# Patient Record
Sex: Female | Born: 1945 | Race: White | Hispanic: No | Marital: Married | Smoking: Never smoker
Health system: Southern US, Community
[De-identification: ages and names within clinical notes are randomized; demographics above are authoritative.]

## PROBLEM LIST (undated history)

## (undated) DIAGNOSIS — J449 Chronic obstructive pulmonary disease, unspecified: Secondary | ICD-10-CM

## (undated) DIAGNOSIS — I10 Essential (primary) hypertension: Secondary | ICD-10-CM

## (undated) DIAGNOSIS — T7840XA Allergy, unspecified, initial encounter: Secondary | ICD-10-CM

## (undated) DIAGNOSIS — M1990 Unspecified osteoarthritis, unspecified site: Secondary | ICD-10-CM

## (undated) HISTORY — DX: Essential (primary) hypertension: I10

## (undated) HISTORY — PX: NO PAST SURGERIES: SHX2092

## (undated) HISTORY — PX: JOINT REPLACEMENT: SHX530

## (undated) HISTORY — DX: Unspecified osteoarthritis, unspecified site: M19.90

## (undated) HISTORY — DX: Allergy, unspecified, initial encounter: T78.40XA

---

## 2013-01-05 DIAGNOSIS — IMO0002 Reserved for concepts with insufficient information to code with codable children: Secondary | ICD-10-CM | POA: Diagnosis not present

## 2013-05-05 DIAGNOSIS — H538 Other visual disturbances: Secondary | ICD-10-CM | POA: Diagnosis not present

## 2013-05-05 DIAGNOSIS — H539 Unspecified visual disturbance: Secondary | ICD-10-CM | POA: Diagnosis not present

## 2013-05-07 DIAGNOSIS — H43819 Vitreous degeneration, unspecified eye: Secondary | ICD-10-CM | POA: Diagnosis not present

## 2013-06-11 DIAGNOSIS — K921 Melena: Secondary | ICD-10-CM | POA: Diagnosis not present

## 2013-06-26 DIAGNOSIS — Z13220 Encounter for screening for lipoid disorders: Secondary | ICD-10-CM | POA: Diagnosis not present

## 2013-06-26 DIAGNOSIS — Z23 Encounter for immunization: Secondary | ICD-10-CM | POA: Diagnosis not present

## 2013-06-26 DIAGNOSIS — Z1329 Encounter for screening for other suspected endocrine disorder: Secondary | ICD-10-CM | POA: Diagnosis not present

## 2013-06-26 DIAGNOSIS — I10 Essential (primary) hypertension: Secondary | ICD-10-CM | POA: Diagnosis not present

## 2013-06-26 DIAGNOSIS — E568 Deficiency of other vitamins: Secondary | ICD-10-CM | POA: Diagnosis not present

## 2013-07-03 DIAGNOSIS — H43399 Other vitreous opacities, unspecified eye: Secondary | ICD-10-CM | POA: Diagnosis not present

## 2013-07-03 DIAGNOSIS — H35039 Hypertensive retinopathy, unspecified eye: Secondary | ICD-10-CM | POA: Diagnosis not present

## 2013-07-06 DIAGNOSIS — Z124 Encounter for screening for malignant neoplasm of cervix: Secondary | ICD-10-CM | POA: Diagnosis not present

## 2013-07-06 DIAGNOSIS — I10 Essential (primary) hypertension: Secondary | ICD-10-CM | POA: Diagnosis not present

## 2013-07-06 DIAGNOSIS — N760 Acute vaginitis: Secondary | ICD-10-CM | POA: Diagnosis not present

## 2013-07-06 DIAGNOSIS — Z0000 Encounter for general adult medical examination without abnormal findings: Secondary | ICD-10-CM | POA: Diagnosis not present

## 2013-07-06 DIAGNOSIS — Z0100 Encounter for examination of eyes and vision without abnormal findings: Secondary | ICD-10-CM | POA: Diagnosis not present

## 2013-07-06 DIAGNOSIS — Z01419 Encounter for gynecological examination (general) (routine) without abnormal findings: Secondary | ICD-10-CM | POA: Diagnosis not present

## 2013-07-06 DIAGNOSIS — Z1331 Encounter for screening for depression: Secondary | ICD-10-CM | POA: Diagnosis not present

## 2013-07-12 DIAGNOSIS — K921 Melena: Secondary | ICD-10-CM | POA: Diagnosis not present

## 2013-07-12 DIAGNOSIS — D126 Benign neoplasm of colon, unspecified: Secondary | ICD-10-CM | POA: Diagnosis not present

## 2013-07-12 DIAGNOSIS — K922 Gastrointestinal hemorrhage, unspecified: Secondary | ICD-10-CM | POA: Diagnosis not present

## 2013-07-12 DIAGNOSIS — K648 Other hemorrhoids: Secondary | ICD-10-CM | POA: Diagnosis not present

## 2013-07-13 DIAGNOSIS — Z0000 Encounter for general adult medical examination without abnormal findings: Secondary | ICD-10-CM | POA: Diagnosis not present

## 2013-10-11 DIAGNOSIS — R55 Syncope and collapse: Secondary | ICD-10-CM | POA: Diagnosis not present

## 2013-10-11 DIAGNOSIS — R0602 Shortness of breath: Secondary | ICD-10-CM | POA: Diagnosis not present

## 2013-10-11 DIAGNOSIS — I10 Essential (primary) hypertension: Secondary | ICD-10-CM | POA: Diagnosis not present

## 2013-10-11 DIAGNOSIS — I453 Trifascicular block: Secondary | ICD-10-CM | POA: Diagnosis not present

## 2013-11-02 DIAGNOSIS — R0602 Shortness of breath: Secondary | ICD-10-CM | POA: Diagnosis not present

## 2013-11-02 DIAGNOSIS — J309 Allergic rhinitis, unspecified: Secondary | ICD-10-CM | POA: Diagnosis not present

## 2013-11-09 DIAGNOSIS — R0989 Other specified symptoms and signs involving the circulatory and respiratory systems: Secondary | ICD-10-CM | POA: Diagnosis not present

## 2013-11-09 DIAGNOSIS — R0609 Other forms of dyspnea: Secondary | ICD-10-CM | POA: Diagnosis not present

## 2013-12-04 DIAGNOSIS — R0602 Shortness of breath: Secondary | ICD-10-CM | POA: Diagnosis not present

## 2013-12-07 DIAGNOSIS — R0602 Shortness of breath: Secondary | ICD-10-CM | POA: Diagnosis not present

## 2013-12-22 DIAGNOSIS — Z1231 Encounter for screening mammogram for malignant neoplasm of breast: Secondary | ICD-10-CM | POA: Diagnosis not present

## 2013-12-22 DIAGNOSIS — Z9189 Other specified personal risk factors, not elsewhere classified: Secondary | ICD-10-CM | POA: Diagnosis not present

## 2014-01-28 ENCOUNTER — Ambulatory Visit (HOSPITAL_COMMUNITY)
Admission: RE | Admit: 2014-01-28 | Discharge: 2014-01-28 | Disposition: A | Discharge status: Home / Self Care | Payer: Medicare Other | Source: Ambulatory Visit | Attending: Family Medicine | Admitting: Family Medicine

## 2014-01-28 ENCOUNTER — Ambulatory Visit (INDEPENDENT_AMBULATORY_CARE_PROVIDER_SITE_OTHER): Payer: Medicare Other | Admitting: Family Medicine

## 2014-01-28 VITALS — BP 114/62 | HR 71 | Temp 98.1°F | Resp 16 | Ht 65.5 in | Wt 169.0 lb

## 2014-01-28 DIAGNOSIS — M79662 Pain in left lower leg: Secondary | ICD-10-CM

## 2014-01-28 DIAGNOSIS — M7989 Other specified soft tissue disorders: Secondary | ICD-10-CM | POA: Diagnosis not present

## 2014-01-28 DIAGNOSIS — I8390 Asymptomatic varicose veins of unspecified lower extremity: Secondary | ICD-10-CM

## 2014-01-28 DIAGNOSIS — M79609 Pain in unspecified limb: Secondary | ICD-10-CM | POA: Insufficient documentation

## 2014-01-28 DIAGNOSIS — I8392 Asymptomatic varicose veins of left lower extremity: Secondary | ICD-10-CM

## 2014-01-28 NOTE — Progress Notes (Signed)
 Chief Complaint:  Chief Complaint  Patient presents with  . Leg Pain    x 1 day.  bruising  Left    HPI: Kristy Jacobs is a 68 y.o. female who is here for  1 day history of left calf pain and swelling with bruising ( which has improved, she has knot in the back of her leg), she recently moved here from [place] to be with her famly. She was in the car for 12 hours with small breaks. She has been very sedentary. She has no fevers , chills, SOB, CP. + dizziness x 2 days before this occurred. No other risk factors for PE. She denies recent surgery, prior DVT/PE, malignancy.  Past Medical History  Diagnosis Date  . Allergy   . Arthritis   . Hypertension    History reviewed. No pertinent past surgical history. History   Social History  . Marital Status: Married    Spouse Name: N/A    Number of Children: N/A  . Years of Education: N/A   Social History Main Topics  . Smoking status: Never Smoker   . Smokeless tobacco: None  . Alcohol Use: None  . Drug Use: None  . Sexual Activity: None   Other Topics Concern  . None   Social History Narrative  . None   Family History  Problem Relation Age of Onset  . Hypertension Mother   . Hypertension Father   . Cancer Father   . Stroke Father   . Hypertension Brother   . Hypertension Sister    Allergies  Allergen Reactions  . Compazine [Prochlorperazine Edisylate] Nausea And Vomiting   Prior to Admission medications   Medication Sig Start Date End Date Taking? Authorizing Provider  lisinopril (PRINIVIL,ZESTRIL) 20 MG tablet Take 20 mg by mouth daily.   Yes Historical Provider, MD     ROS: The patient denies fevers, chills, night sweats, unintentional weight loss, chest pain, palpitations, wheezing, dyspnea on exertion, nausea, vomiting, abdominal pain, dysuria, hematuria, melena, numbness, weakness, or tingling.   All other systems have been reviewed and were otherwise negative with the exception of those mentioned in the  HPI and as above.    PHYSICAL EXAM: Filed Vitals:   01/28/14 0847  BP: 114/62  Pulse: 71  Temp: 98.1 F (36.7 C)  Resp: 16   Filed Vitals:   01/28/14 0847  Height: 5' 5.5" (1.664 m)  Weight: 169 lb (76.658 kg)   Body mass index is 27.69 kg/(m^2).  General: Alert, no acute distress HEENT:  Normocephalic, atraumatic, oropharynx patent. EOMI, PERRLA Cardiovascular:  Regular rate and rhythm, no rubs murmurs or gallops.  No Carotid bruits, radial pulse intact. No pedal edema.  Respiratory: Clear to auscultation bilaterally.  No wheezes, rales, or rhonchi.  No cyanosis, no use of accessory musculature GI: No organomegaly, abdomen is soft and non-tender, positive bowel sounds.  No masses. Skin: No rashes. Neurologic: Facial musculature symmetric. Psychiatric: Patient is appropriate throughout our interaction. Lymphatic: No cervical lymphadenopathy Musculoskeletal: Gait intact. + left calf pain, + calf bruise, + calf firmness 5/5 strength    LABS: No results found for this or any previous visit.   EKG/XRAY:   Primary read interpreted by Dr. Marin Comment at [facility].   ASSESSMENT/PLAN: Encounter Diagnoses  Name Primary?  . Calf pain, left Yes  . Calf swelling   . Varicose veins of left lower extremity    Rule out DVT More likely  superficial varicose vein hemorrhage but she  has never had this before.  She was recently in a 12 hr car ride, no other risk factors.  Fu after Doppler results.   Gross sideeffects, risk and benefits, and alternatives of medications d/w patient. Patient is aware that all medications have potential sideeffects and we are unable to predict every sideeffect or drug-drug interaction that may occur.  , [place], DO 01/28/2014 9:29 AM

## 2014-01-31 ENCOUNTER — Telehealth: Payer: Self-pay

## 2014-01-31 NOTE — Telephone Encounter (Signed)
See below. Pt says she's talked to Dr. Marin Comment already about this.   PLease let her know official Doppler was negative. If the area bothers her then she can try compressions stockings or I can refer her to vascular for further treatment options. She can let me know if she wants the referral or if she wants to wait   Thanks  Dr Marin Comment

## 2014-01-31 NOTE — Telephone Encounter (Signed)
Message copied by Constance Goltz on Thu Jan 31, 2014  6:29 PM ------      Message from: Rikki Spearing P      Created: Thu Jan 31, 2014  7:53 AM       PLease let her know official Doppler was negative. If the area bothers her then she can try compressions stockings or I can refer her to vascular for further treatment options. She can let me know if she wants the referral or if she wants to wait             Thanks      Dr Marin Comment ------

## 2014-03-21 ENCOUNTER — Ambulatory Visit (INDEPENDENT_AMBULATORY_CARE_PROVIDER_SITE_OTHER): Payer: Medicare Other | Admitting: Family Medicine

## 2014-03-21 VITALS — BP 124/80 | HR 76 | Temp 98.6°F | Resp 16 | Ht 65.5 in | Wt 173.0 lb

## 2014-03-21 DIAGNOSIS — L821 Other seborrheic keratosis: Secondary | ICD-10-CM

## 2014-03-21 DIAGNOSIS — B354 Tinea corporis: Secondary | ICD-10-CM

## 2014-03-21 MED ORDER — MICONAZOLE NITRATE 2 % EX CREA: 1.0000 "application " | TOPICAL_CREAM | Freq: Two times a day (BID) | CUTANEOUS | Status: DC

## 2014-03-21 NOTE — Progress Notes (Signed)
  Kristy Jacobs - 68 y.o. female MRN [id number]  Date of birth: Oct 18, 1945  CC & HPI:  The patient presents for evaluation of: Chief Complaint  Patient presents with  . Rash    right shoulder rash x2 weeks; spreaded from arm; denies fever/chills; OTC Benadryl itch cream with no relief.  Reports thinking it was poison IVY from her cat.  No issues with rash on regular basis.  Hx of contact dermatitis   Also reports dark area on left cheek.  Has been present for >2 years.  No significant changes but prominent.   ROS:  Per HPI.   HISTORY: Past Medical, Surgical, Social, and Family History Reviewed & Updated per EMR.  Pertinent Historical Findings include: Hx of HTN, Allergy, HTN.  No kidney or liver issues. No prior Skin CA Non-smoker  OBJECTIVE FINDINGS:  VS:   HT:5' 5.5" (166.4 cm)   WT:173 lb (78.472 kg)  BMI:28.4          BP:124/80 mmHg  HR:76bpm  TEMP:98.6 F (37 C)(Oral)  RESP:97 %  PHYSICAL EXAM: GENERAL: Adult Caucasian  female. In no discomfort; no respiratory distress   PSYCH: alert and appropriate, good insight   SKIN: Left Cheek: 0.5cm waxy macule, well circumscribed Left forearm: small area of hyperpigmentation consistent with post-inflammatory process. Right Axilla: erythematous and scaly 1.5X4cm lesion over R anterior axillary region. No streaking erythema but slight surrounding erythema.     ASSESSMENT: 1. Tinea corporis   2. Seborrheic keratoses    PLAN: See problem based charting & AVS for additional documentation. Meds ordered this encounter  Medications  . miconazole (MICATIN) 2 % cream    Sig: Apply 1 application topically 2 (two) times daily. Apply for 14 days    Dispense:  28.35 g    Refill:  0    No orders of the defined types were placed in this encounter.      Discussed indication for removal of SK.  Recommend follow up if worsening for consideration of cryotherapy > Return if symptoms worsen or fail to improve.

## 2014-03-21 NOTE — Patient Instructions (Signed)

## 2014-04-05 NOTE — Progress Notes (Signed)
History and physical examinations reviewed; agree with assessment and plan.

## 2014-07-19 ENCOUNTER — Encounter: Payer: Self-pay | Admitting: Family Medicine

## 2014-07-19 ENCOUNTER — Ambulatory Visit (INDEPENDENT_AMBULATORY_CARE_PROVIDER_SITE_OTHER): Payer: Medicare Other | Admitting: Family Medicine

## 2014-07-19 VITALS — BP 154/83 | HR 63 | Temp 97.7°F | Resp 16 | Ht 65.0 in | Wt 169.0 lb

## 2014-07-19 DIAGNOSIS — Z1329 Encounter for screening for other suspected endocrine disorder: Secondary | ICD-10-CM | POA: Diagnosis not present

## 2014-07-19 DIAGNOSIS — Z2821 Immunization not carried out because of patient refusal: Secondary | ICD-10-CM | POA: Diagnosis not present

## 2014-07-19 DIAGNOSIS — Z8349 Family history of other endocrine, nutritional and metabolic diseases: Secondary | ICD-10-CM | POA: Diagnosis not present

## 2014-07-19 DIAGNOSIS — B372 Candidiasis of skin and nail: Secondary | ICD-10-CM

## 2014-07-19 DIAGNOSIS — Z0000 Encounter for general adult medical examination without abnormal findings: Secondary | ICD-10-CM | POA: Diagnosis not present

## 2014-07-19 DIAGNOSIS — I10 Essential (primary) hypertension: Secondary | ICD-10-CM

## 2014-07-19 DIAGNOSIS — R0602 Shortness of breath: Secondary | ICD-10-CM

## 2014-07-19 DIAGNOSIS — Z1231 Encounter for screening mammogram for malignant neoplasm of breast: Secondary | ICD-10-CM | POA: Diagnosis not present

## 2014-07-19 LAB — CBC
HCT: 43.4 % (ref 36.0–46.0)
Hemoglobin: 14.5 g/dL (ref 12.0–15.0)
MCH: 28.2 pg (ref 26.0–34.0)
MCHC: 33.4 g/dL (ref 30.0–36.0)
MCV: 84.3 fL (ref 78.0–100.0)
MPV: 10.9 fL (ref 8.6–12.4)
Platelets: 329 10*3/uL (ref 150–400)
RBC: 5.15 MIL/uL — ABNORMAL HIGH (ref 3.87–5.11)
RDW: 13.9 % (ref 11.5–15.5)
WBC: 7.3 10*3/uL (ref 4.0–10.5)

## 2014-07-19 LAB — LIPID PANEL
Cholesterol: 195 mg/dL (ref 0–200)
HDL: 65 mg/dL (ref >39)
LDL Cholesterol: 110 mg/dL — ABNORMAL HIGH (ref 0–99)
Total CHOL/HDL Ratio: 3 ratio
Triglycerides: 99 mg/dL (ref <150)
VLDL: 20 mg/dL (ref 0–40)

## 2014-07-19 LAB — COMPLETE METABOLIC PANEL WITHOUT GFR
ALT: 13 U/L (ref 0–35)
Albumin: 3.9 g/dL (ref 3.5–5.2)
Alkaline Phosphatase: 77 U/L (ref 39–117)
BUN: 17 mg/dL (ref 6–23)
CO2: 27 meq/L (ref 19–32)
Chloride: 103 meq/L (ref 96–112)
Glucose, Bld: 76 mg/dL (ref 70–99)
Total Bilirubin: 0.5 mg/dL (ref 0.2–1.2)

## 2014-07-19 LAB — COMPLETE METABOLIC PANEL WITH GFR
AST: 19 U/L (ref 0–37)
Calcium: 9.2 mg/dL (ref 8.4–10.5)
Creat: 0.88 mg/dL (ref 0.50–1.10)
GFR, Est African American: 79 mL/min
GFR, Est Non African American: 69 mL/min
Potassium: 4.7 mEq/L (ref 3.5–5.3)
Sodium: 138 mEq/L (ref 135–145)
Total Protein: 7.2 g/dL (ref 6.0–8.3)

## 2014-07-19 MED ORDER — NYSTATIN 100000 UNIT/GM EX POWD: CUTANEOUS | Status: DC

## 2014-07-19 MED ORDER — LISINOPRIL 20 MG PO TABS: 20.0000 mg | ORAL_TABLET | Freq: Every day | ORAL | Status: DC

## 2014-07-19 NOTE — Progress Notes (Signed)
Chief Complaint:  Chief Complaint  Patient presents with  . Annual Exam  . Gynecologic Exam    HPI: Kristy Jacobs is a 69 y.o. female who is here for PE with pap Last pap January 2015, no prior abnormal pap Last mammogram July 2015 No uterine, ovarian, breast cancer Late 31s LMP, no vaginal bleeding or spotting after cessation of period She does not do flu vaccine She has had PNA vaccine ? Last 2 years She has had TDap in March 2014 Has had shingles 2011 , mild case, on bra line She has had colonscopy , 2015, has had 3 and all normal.  HAs regular eye exams Has had a bone density scan and was normal Needs BP medications and also has candida intertrigo underneath her bra HAs  ahistory of SOB and has seen cardiology and also pumlnology for SOB and PFTs, was given inhaler but never used.     Past Medical History  Diagnosis Date  . Allergy   . Arthritis   . Hypertension    History reviewed. No pertinent past surgical history. History   Social History  . Marital Status: Married    Spouse Name: N/A    Number of Children: N/A  . Years of Education: N/A   Social History Main Topics  . Smoking status: Never Smoker   . Smokeless tobacco: None  . Alcohol Use: 1.2 oz/week    2 Glasses of wine per week  . Drug Use: No  . Sexual Activity: Yes   Other Topics Concern  . None   Social History Narrative   Family History  Problem Relation Age of Onset  . Hypertension Mother   . Hypertension Father   . Cancer Father   . Stroke Father   . Hypertension Brother   . Hypertension Sister    Allergies  Allergen Reactions  . Compazine [Prochlorperazine Edisylate] Nausea And Vomiting   Prior to Admission medications   Medication Sig Start Date End Date Taking? Authorizing Provider  lisinopril (PRINIVIL,ZESTRIL) 20 MG tablet Take 20 mg by mouth daily.   Yes Historical Provider, MD  nystatin cream (MYCOSTATIN) Apply 1 application topically 2 (two) times daily.   Yes  Historical Provider, MD     ROS: The patient denies fevers, chills, night sweats, unintentional weight loss, chest pain, palpitations, wheezing, dyspnea on exertion, nausea, vomiting, abdominal pain, dysuria, hematuria, melena, numbness, weakness, or tingling.   All other systems have been reviewed and were otherwise negative with the exception of those mentioned in the HPI and as above.    PHYSICAL EXAM: Filed Vitals:   07/19/14 1112  BP: 154/83  Pulse: 63  Temp: 97.7 F (36.5 C)  Resp: 16   Filed Vitals:   07/19/14 1112  Height: 5\' 5"  (1.651 m)  Weight: 169 lb (76.658 kg)   Body mass index is 28.12 kg/(m^2).  General: Alert, no acute distress HEENT:  Normocephalic, atraumatic, oropharynx patent. EOMI, PERRLA Cardiovascular:  Regular rate and rhythm, no rubs murmurs or gallops.  No Carotid bruits, radial pulse intact. No pedal edema.  Respiratory: Clear to auscultation bilaterally.  No wheezes, rales, or rhonchi.  No cyanosis, no use of accessory musculature GI: No organomegaly, abdomen is soft and non-tender, positive bowel sounds.  No masses. Skin: No rashes. Neurologic: Facial musculature symmetric. Psychiatric: Patient is appropriate throughout our interaction. Lymphatic: No cervical lymphadenopathy Musculoskeletal: Gait intact. Breast exam normal, defer GU exam since no vaginal dc or sxs   LABS: No  results found for this or any previous visit.   EKG/XRAY:   Primary read interpreted by Dr. Marin Comment at [facility].   ASSESSMENT/PLAN: Encounter Diagnoses  Name Primary?  . Essential hypertension   . Annual physical exam Yes  . Candidal intertrigo   . SOB (shortness of breath)   . Screening for thyroid disorder   . 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 Patient is aware that all medications have potential sideeffects and we are unable to predict every sideeffect or drug-drug interaction that may occur.  [name], [place], DO 07/19/2014 12:28 PM

## 2014-07-20 LAB — TSH: TSH: 1.471 u[IU]/mL (ref 0.350–4.500)

## 2014-07-30 ENCOUNTER — Encounter: Payer: Self-pay | Admitting: Family Medicine

## 2014-10-23 ENCOUNTER — Telehealth: Payer: Self-pay | Admitting: Family Medicine

## 2014-10-23 NOTE — Telephone Encounter (Signed)
lmom to call back and set another appt up with another provider in aug. Dr Marin Comment isn't doing anymore appt

## 2015-01-17 ENCOUNTER — Ambulatory Visit: Payer: Medicare Other | Admitting: Family Medicine

## 2015-06-14 ENCOUNTER — Ambulatory Visit (INDEPENDENT_AMBULATORY_CARE_PROVIDER_SITE_OTHER): Payer: Medicare Other | Admitting: Family Medicine

## 2015-06-14 ENCOUNTER — Ambulatory Visit (INDEPENDENT_AMBULATORY_CARE_PROVIDER_SITE_OTHER): Payer: Medicare Other

## 2015-06-14 VITALS — BP 122/76 | HR 70 | Temp 98.2°F | Resp 16 | Ht 65.0 in | Wt 167.0 lb

## 2015-06-14 DIAGNOSIS — R0600 Dyspnea, unspecified: Secondary | ICD-10-CM

## 2015-06-14 DIAGNOSIS — R05 Cough: Secondary | ICD-10-CM

## 2015-06-14 DIAGNOSIS — I4510 Unspecified right bundle-branch block: Secondary | ICD-10-CM

## 2015-06-14 DIAGNOSIS — R059 Cough, unspecified: Secondary | ICD-10-CM

## 2015-06-14 DIAGNOSIS — R0609 Other forms of dyspnea: Secondary | ICD-10-CM

## 2015-06-14 DIAGNOSIS — R062 Wheezing: Secondary | ICD-10-CM | POA: Diagnosis not present

## 2015-06-14 DIAGNOSIS — J069 Acute upper respiratory infection, unspecified: Secondary | ICD-10-CM

## 2015-06-14 MED ORDER — ALBUTEROL SULFATE HFA 108 (90 BASE) MCG/ACT IN AERS: 1.0000 | INHALATION_SPRAY | RESPIRATORY_TRACT | Status: DC | PRN

## 2015-06-14 NOTE — Addendum Note (Signed)
Addended by: Merri Ray R on: 06/14/2015 11:31 AM   Modules accepted: Orders

## 2015-06-14 NOTE — Progress Notes (Addendum)
Subjective:  This chart was scribed for Kristy Ray, MD by [facility], medical scribe at [facility].The patient was seen in exam room 10 and the patient's care was started at 10:31 AM.   Patient ID: Kristy Jacobs, female    DOB: [birth year], 69 y.o.   MRN: [id number] Chief Complaint  Patient presents with  . head congestion    x 2 days  . Wheezing  . Cough    x 5 days  . Dizziness    x 4 days, comes and goes    HPI HPI Comments: Kristy Jacobs is a 69 y.o. female who presents to [facility] with multiple concerns. Symptoms began about 5 days ago. At that time she developed a fever of 102 , but no fever for the past 3 days. The non productive cough has persisted, she also has SOB,  post nasal drip, sinus congestion and wheezing at night when laying down. She does not notice any wheezing during the day. Intermittent dizziness but no syncope episodes. No nasal discharge. Taking mucinex, also drinking tea with honey. No stroke like symptoms, no focal weakness, no CP. Sick contacts at home.  Increased SOB with activity.  First noticed this 10-15 years ago but more prevalent recently. Pulmonary function testing and stress test 2 years ago in [place]. Told she had an electrical issue with her heart, but she cannot remember the name of the condition (possibly RBBB). Told she may need a pacemaker, and she could have possible syncopal episodes. No hx of asthma or COPD, heart failure. Fhx of HTN, father had a stroke. 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  HENT: Positive for congestion, postnasal drip and sinus pressure.   Respiratory: Positive for cough, shortness of breath and wheezing. Negative for chest tightness.   Cardiovascular: Negative for chest pain, palpitations and leg swelling.  Gastrointestinal: Negative for abdominal pain  and blood in stool.  Neurological: Positive for dizziness. Negative for syncope, weakness, light-headedness and headaches.      Objective:   Physical Exam  Constitutional: She is oriented to person, place, and time. She appears well-developed and well-nourished. No distress.  HENT:  Head: Normocephalic and atraumatic.  Right Ear: Hearing, tympanic membrane, external ear and ear canal normal.  Left Ear: Hearing, tympanic membrane, external ear and ear canal normal.  Nose: Nose normal.  Mouth/Throat: Oropharynx is clear and moist and mucous membranes are normal. No oropharyngeal exudate.  Eyes: Conjunctivae and EOM are normal. Pupils are equal, round, and reactive to light.  Neck: Carotid bruit is not present.  Cardiovascular: Normal rate, regular rhythm, normal heart sounds and intact distal pulses.   No murmur heard. Pulmonary/Chest: Effort normal and breath sounds normal. No respiratory distress. She has no wheezes. She has no rhonchi.  Abdominal: Soft. She exhibits no pulsatile midline mass. There is no tenderness.  Neurological: She is alert and oriented to person, place, and time.  Skin: Skin is warm and dry. No rash noted.  Psychiatric: She has a normal mood and affect. Her behavior is normal.  Vitals reviewed. EKG compared to 2014 RBBB difficult interpretation on previous EKG due to scan, but no apparent changed. [facility] reading (PRIMARY) by Dr.Dayonna Selbe: Chest x-Jacobs shows no acute disease.      Assessment & Plan:   Kataryna Kin is a 69 y.o. female Cough -  Plan: DG Chest 2 View, albuterol (PROVENTIL HFA;VENTOLIN HFA) 108 (90 Base) MCG/ACT inhaler Wheezing - Plan: albuterol (PROVENTIL HFA;VENTOLIN HFA) 108 (90 Base) MCG/ACT inhaler Acute upper respiratory infection  -Suspected viral URI, now afebrile and improving. Intermittent wheezing night may actually be some congestion, but clear on exam today. Albuterol was prescribed if needed, but side effects discussed and RTC precautions  if persistent or frequent use. Continue symptomatic care with saline nasal spray, Mucinex, RTC precautions.  DOE (dyspnea on exertion) - Plan: EKG 12-Lead, DG Chest 2 View RBBB - Plan: EKG 12-Lead  -Reported normal workup by both pulmonary and cardiology 2 years ago except for bundle branch block. Will refer to cardiologist locally to decide on other testing if needed as it appears her dyspnea on exertion may be more frequent. ER/RTC/911 precautions discussed. Advised against increasing exercise until she is cleared to do so by cardiology.    Meds ordered this encounter  Medications  . GuaiFENesin (MUCINEX PO)    Sig: Take by mouth.  Marland Kitchen albuterol (PROVENTIL HFA;VENTOLIN HFA) 108 (90 Base) MCG/ACT inhaler    Sig: Inhale 1-2 puffs into the lungs every 4 (four) hours as needed for wheezing or shortness of breath.    Dispense:  1 Inhaler    Refill:  0   Patient Instructions  I suspect you had a viral illness or flulike illness that is improving. Continue Mucinex or Mucinex DM as needed for cough, drink plenty of fluids, saline nasal spray as needed for nasal congestion. These are all over-the-counter. If you do wheeze at night, you can try 1-2 puffs of the albuterol up to every 6 hours. If you have to use this medicine frequently or more than the next few days, return for recheck.   I will refer you to a cardiologist locally to discuss your shortness of breath with exertion, but I do not see any apparent changes on your EKG in the office.Return to the clinic or go to the nearest emergency room if any of your symptoms worsen or new symptoms occur.       By signing my name below, I, Nadim Abuhashem, attest that this documentation has been prepared under the direction and in the presence of Kristy Ray, MD.  Electronically Signed: Lora Havens, medical scribe. 06/14/2015, 11:17 AM.

## 2015-06-14 NOTE — Patient Instructions (Signed)
I suspect you had a viral illness or flulike illness that is improving. Continue Mucinex or Mucinex DM as needed for cough, drink plenty of fluids, saline nasal spray as needed for nasal congestion. These are all over-the-counter. If you do wheeze at night, you can try 1-2 puffs of the albuterol up to every 6 hours. If you have to use this medicine frequently or more than the next few days, return for recheck.   I will refer you to a cardiologist locally to discuss your shortness of breath with exertion, but I do not see any apparent changes on your EKG in the office.Return to the clinic or go to the nearest emergency room if any of your symptoms worsen or new symptoms occur.

## 2015-06-27 ENCOUNTER — Telehealth: Payer: Self-pay | Admitting: Family Medicine

## 2015-06-27 NOTE — Telephone Encounter (Signed)
Appt to be made on Feb. 7 @ 8:45 am per Erline Levine.Pt notified and Dr. Marin Comment made aware.

## 2015-06-27 NOTE — Telephone Encounter (Signed)
-----   Message from Glenford Bayley, DO sent at 06/27/2015  9:04 AM EST ----- Regarding: Feb 7 or 9 appt Hi all-  Can you schedule me an appt for Mrs Makhala Parkman for a full physical in the AM on either Feb 7 or Feb 9.   Can you call the patient when you make the appt, I have already spoken with Dr Tamala Julian and she is ok with me taking 1 room in the morning for this specific appt.   Thanks much, Dr Marin Comment

## 2015-07-21 DIAGNOSIS — M1990 Unspecified osteoarthritis, unspecified site: Secondary | ICD-10-CM | POA: Insufficient documentation

## 2015-07-21 DIAGNOSIS — T7840XA Allergy, unspecified, initial encounter: Secondary | ICD-10-CM | POA: Insufficient documentation

## 2015-07-21 DIAGNOSIS — I10 Essential (primary) hypertension: Secondary | ICD-10-CM | POA: Insufficient documentation

## 2015-07-22 ENCOUNTER — Ambulatory Visit (INDEPENDENT_AMBULATORY_CARE_PROVIDER_SITE_OTHER): Payer: Medicare Other | Admitting: Family Medicine

## 2015-07-22 ENCOUNTER — Encounter: Payer: Self-pay | Admitting: Family Medicine

## 2015-07-22 VITALS — BP 135/80 | HR 71 | Temp 98.1°F | Resp 16 | Ht 65.0 in | Wt 163.6 lb

## 2015-07-22 DIAGNOSIS — I10 Essential (primary) hypertension: Secondary | ICD-10-CM

## 2015-07-22 DIAGNOSIS — R21 Rash and other nonspecific skin eruption: Secondary | ICD-10-CM | POA: Diagnosis not present

## 2015-07-22 DIAGNOSIS — E2839 Other primary ovarian failure: Secondary | ICD-10-CM | POA: Diagnosis not present

## 2015-07-22 DIAGNOSIS — Z1329 Encounter for screening for other suspected endocrine disorder: Secondary | ICD-10-CM | POA: Diagnosis not present

## 2015-07-22 DIAGNOSIS — E7800 Pure hypercholesterolemia, unspecified: Secondary | ICD-10-CM

## 2015-07-22 DIAGNOSIS — Z1231 Encounter for screening mammogram for malignant neoplasm of breast: Secondary | ICD-10-CM

## 2015-07-22 DIAGNOSIS — Z124 Encounter for screening for malignant neoplasm of cervix: Secondary | ICD-10-CM

## 2015-07-22 DIAGNOSIS — Z0000 Encounter for general adult medical examination without abnormal findings: Secondary | ICD-10-CM | POA: Diagnosis not present

## 2015-07-22 LAB — COMPLETE METABOLIC PANEL WITH GFR
Alkaline Phosphatase: 85 U/L (ref 33–130)
CO2: 27 mmol/L (ref 20–31)
GFR, Est Non African American: 70 mL/min (ref >=60)
Glucose, Bld: 84 mg/dL (ref 65–99)
Potassium: 4 mmol/L (ref 3.5–5.3)
Sodium: 141 mmol/L (ref 135–146)

## 2015-07-22 LAB — LIPID PANEL
Cholesterol: 186 mg/dL (ref 125–200)
HDL: 67 mg/dL (ref >=46)
LDL Cholesterol: 103 mg/dL (ref <130)
Total CHOL/HDL Ratio: 2.8 Ratio (ref <=5.0)
Triglycerides: 81 mg/dL (ref <150)
VLDL: 16 mg/dL (ref <30)

## 2015-07-22 LAB — COMPLETE METABOLIC PANEL WITHOUT GFR
ALT: 12 U/L (ref 6–29)
AST: 20 U/L (ref 10–35)
Albumin: 4.1 g/dL (ref 3.6–5.1)
BUN: 12 mg/dL (ref 7–25)
Calcium: 9.1 mg/dL (ref 8.6–10.4)
Chloride: 104 mmol/L (ref 98–110)
Creat: 0.86 mg/dL (ref 0.50–0.99)
GFR, Est African American: 81 mL/min (ref >=60)
Total Bilirubin: 0.5 mg/dL (ref 0.2–1.2)
Total Protein: 7.4 g/dL (ref 6.1–8.1)

## 2015-07-22 LAB — CBC WITH DIFFERENTIAL/PLATELET
Basophils Absolute: 0.1 10*3/uL (ref 0.0–0.1)
Basophils Relative: 1 % (ref 0–1)
Eosinophils Absolute: 0.2 10*3/uL (ref 0.0–0.7)
Eosinophils Relative: 2 % (ref 0–5)
HCT: 45 % (ref 36.0–46.0)
Hemoglobin: 15 g/dL (ref 12.0–15.0)
Lymphocytes Relative: 19 % (ref 12–46)
Lymphs Abs: 1.4 10*3/uL (ref 0.7–4.0)
MCH: 28.2 pg (ref 26.0–34.0)
MCHC: 33.3 g/dL (ref 30.0–36.0)
MCV: 84.7 fL (ref 78.0–100.0)
MPV: 10.7 fL (ref 8.6–12.4)
Monocytes Absolute: 0.5 10*3/uL (ref 0.1–1.0)
Monocytes Relative: 7 % (ref 3–12)
Neutro Abs: 5.3 10*3/uL (ref 1.7–7.7)
Neutrophils Relative %: 71 % (ref 43–77)
Platelets: 325 10*3/uL (ref 150–400)
RBC: 5.31 MIL/uL — ABNORMAL HIGH (ref 3.87–5.11)
RDW: 14 % (ref 11.5–15.5)
WBC: 7.5 10*3/uL (ref 4.0–10.5)

## 2015-07-22 LAB — TSH: TSH: 1.1 mIU/L

## 2015-07-22 MED ORDER — LISINOPRIL 20 MG PO TABS: 20.0000 mg | ORAL_TABLET | Freq: Every day | ORAL | Status: DC

## 2015-07-22 NOTE — Progress Notes (Addendum)
Chief Complaint:  Chief Complaint  Patient presents with  . Annual Exam    pap    HPI: Kristy Jacobs is a 70 y.o. female who reports to [facility] today complaining of here for annual with pap She is G2L2 She is postmenopausal She is doing well. Declines any pneumonia vaccine, flu vaccine She has no complaints Walks about 1 mile, 15 min She did not get her mammogram last year She has a rash on her nose and needs derm referral    Last OV:  Kristy Jacobs is a 70 y.o. female who is here for PE with pap Last pap January 2015, no prior abnormal pap Last mammogram July 2015 No uterine, ovarian, breast cancer Late 58s LMP, no vaginal bleeding or spotting after cessation of period She does not do flu vaccine She has had PNA vaccine ? Last 2 years She has had TDap in March 2014 Has had shingles 2011 , mild case, on bra line She has had colonscopy , 2015, has had 3 and all normal.  HAs regular eye exams Has had a bone density scan and was normal Needs BP medications and also has candida intertrigo underneath her bra HAs ahistory of SOB and has seen cardiology and also pumlnology for SOB and PFTs, was given inhaler but never used.    Past Medical History  Diagnosis Date  . Allergy   . Arthritis   . Hypertension    Past Surgical History  Procedure Laterality Date  . No past surgeries     Social History   Social History  . Marital Status: Married    Spouse Name: N/A  . Number of Children: N/A  . Years of Education: N/A   Social History Main Topics  . Smoking status: Never Smoker   . Smokeless tobacco: Never Used  . Alcohol Use: 1.2 oz/week    2 Glasses of wine per week  . Drug Use: No  . Sexual Activity: Yes   Other Topics Concern  . None   Social History Narrative   Family History  Problem Relation Age of Onset  . Hypertension Mother   . Hypertension Father   . Cancer Father   . Stroke Father   . Hypertension Brother   . Hypertension Sister     Allergies  Allergen Reactions  . Compazine [Prochlorperazine Edisylate] Nausea And Vomiting   Prior to Admission medications   Medication Sig Start Date End Date Taking? Authorizing Provider  albuterol (PROVENTIL HFA;VENTOLIN HFA) 108 (90 Base) MCG/ACT inhaler Inhale 1-2 puffs into the lungs every 4 (four) hours as needed for wheezing or shortness of breath. 06/14/15  Yes Wendie Agreste, MD  GuaiFENesin (MUCINEX PO) Take by mouth.   Yes Historical Provider, MD  lisinopril (PRINIVIL,ZESTRIL) 20 MG tablet Take 1 tablet (20 mg total) by mouth daily. 07/19/14  Yes Kaitlyne Friedhoff P Fredie Majano, DO  nystatin (MYCOSTATIN/NYSTOP) 100000 UNIT/GM POWD Apply to affected area 07/19/14  Yes Shavell Nored P Fidencio Duddy, DO     ROS: The patient denies fevers, chills, night sweats, unintentional weight loss, chest pain, palpitations, wheezing, dyspnea on exertion, nausea, vomiting, abdominal pain, dysuria, hematuria, melena, numbness, weakness, or tingling.   All other systems have been reviewed and were otherwise negative with the exception of those mentioned in the HPI and as above.    PHYSICAL EXAM: Filed Vitals:   07/22/15 0853 07/22/15 0855  BP: 170/89 135/80  Pulse: 71   Temp: 98.1 F (36.7 C)   Resp: 16  Body mass index is 27.22 kg/(m^2).   General: Alert, no acute distress HEENT:  Normocephalic, atraumatic, oropharynx patent. EOMI, PERRLA Cardiovascular:  Regular rate and rhythm, no rubs murmurs or gallops.  No Carotid bruits, radial pulse intact. No pedal edema.  Respiratory: Clear to auscultation bilaterally.  No wheezes, rales, or rhonchi.  No cyanosis, no use of accessory musculature Abdominal: No organomegaly, abdomen is soft and non-tender, positive bowel sounds. No masses. Skin: No rashes. Neurologic: Facial musculature symmetric. Psychiatric: Patient acts appropriately throughout our interaction. Lymphatic: No cervical or submandibular lymphadenopathy Musculoskeletal: Gait intact. No edema,  tenderness Breast-normal breast exam, no LAD GU-excess skin fkormation around cervical os. Some iminimal inflammation. Neg CMT.    LABS: Results for orders placed or performed in visit on 07/19/14  COMPLETE METABOLIC PANEL WITH GFR  Result Value Ref Range   Sodium 138 135 - 145 mEq/L   Potassium 4.7 3.5 - 5.3 mEq/L   Chloride 103 96 - 112 mEq/L   CO2 27 19 - 32 mEq/L   Glucose, Bld 76 70 - 99 mg/dL   BUN 17 6 - 23 mg/dL   Creat 0.88 0.50 - 1.10 mg/dL   Total Bilirubin 0.5 0.2 - 1.2 mg/dL   Alkaline Phosphatase 77 39 - 117 U/L   AST 19 0 - 37 U/L   ALT 13 0 - 35 U/L   Total Protein 7.2 6.0 - 8.3 g/dL   Albumin 3.9 3.5 - 5.2 g/dL   Calcium 9.2 8.4 - 10.5 mg/dL   GFR, Est African American 79 mL/min   GFR, Est Non African American 69 mL/min  Lipid panel  Result Value Ref Range   Cholesterol 195 0 - 200 mg/dL   Triglycerides 99 <150 mg/dL   HDL 65 >39 mg/dL   Total CHOL/HDL Ratio 3.0 Ratio   VLDL 20 0 - 40 mg/dL   LDL Cholesterol 110 (H) 0 - 99 mg/dL  TSH  Result Value Ref Range   TSH 1.471 0.350 - 4.500 uIU/mL  CBC  Result Value Ref Range   WBC 7.3 4.0 - 10.5 K/uL   RBC 5.15 (H) 3.87 - 5.11 MIL/uL   Hemoglobin 14.5 12.0 - 15.0 g/dL   HCT 43.4 36.0 - 46.0 %   MCV 84.3 78.0 - 100.0 fL   MCH 28.2 26.0 - 34.0 pg   MCHC 33.4 30.0 - 36.0 g/dL   RDW 13.9 11.5 - 15.5 %   Platelets 329 150 - 400 K/uL   MPV 10.9 8.6 - 12.4 fL     EKG/XRAY:   Primary read interpreted by Dr. Marin Comment at [facility].   ASSESSMENT/PLAN: Encounter Diagnoses  Name Primary?  . Medicare annual wellness visit, subsequent Yes  . Essential hypertension   . Elevated LDL cholesterol level   . Screening for thyroid disorder   . Screening for cervical cancer   . Rash and nonspecific skin eruption   . Encounter for screening mammogram for breast cancer   . Estrogen deficiency     Refill Lisinopril x 1 year Refer to Dermatology  For [facility] labs pending REfer to [facility] for mammogram and dexa She will get  records from prior PCP for [place], unable to get get all records.  Labs pending. Pap pending She was referred to cardiology on last visit with Dr Carlota Raspberry and has not gone, she exercises and does not have SOB/CP, seh will decide if she wants to go, prefers female cardiologist. Risk and benefits d/w patient.   Gross sideeffects, risk and  benefits, and alternatives of medications d/w patient. Patient is aware that all medications have potential sideeffects and we are unable to predict every sideeffect or drug-drug interaction that may occur.  Ryland Smoots DO  07/22/2015 10:03 AM   Spoke with patient about dexa scan

## 2015-07-23 ENCOUNTER — Telehealth: Payer: Self-pay

## 2015-07-23 DIAGNOSIS — I10 Essential (primary) hypertension: Secondary | ICD-10-CM

## 2015-07-23 LAB — PAP IG W/ RFLX HPV ASCU

## 2015-07-23 MED ORDER — LISINOPRIL 20 MG PO TABS: 20.0000 mg | ORAL_TABLET | Freq: Every day | ORAL | Status: DC

## 2015-07-23 NOTE — Telephone Encounter (Signed)
I have sent into [facility] mail order

## 2015-07-23 NOTE — Telephone Encounter (Signed)
Pt states she saw dr Marin Comment last night and the medication she was give was sent to [facility] and it should have been thru [employer] number [phone]

## 2015-07-29 ENCOUNTER — Ambulatory Visit: Payer: Medicare Other | Admitting: Cardiovascular Disease

## 2015-08-11 DIAGNOSIS — M8589 Other specified disorders of bone density and structure, multiple sites: Secondary | ICD-10-CM | POA: Diagnosis not present

## 2015-08-11 DIAGNOSIS — Z780 Asymptomatic menopausal state: Secondary | ICD-10-CM | POA: Diagnosis not present

## 2015-08-11 DIAGNOSIS — Z1231 Encounter for screening mammogram for malignant neoplasm of breast: Secondary | ICD-10-CM | POA: Diagnosis not present

## 2015-08-11 LAB — HM MAMMOGRAPHY

## 2015-08-11 LAB — HM DEXA SCAN

## 2015-08-25 ENCOUNTER — Encounter: Payer: Self-pay | Admitting: Family Medicine

## 2015-08-26 ENCOUNTER — Encounter: Payer: Self-pay | Admitting: Family Medicine

## 2015-09-05 ENCOUNTER — Telehealth: Payer: Self-pay | Admitting: Family Medicine

## 2015-09-05 NOTE — Addendum Note (Signed)
Addended by: Wyatt Haste on: 09/05/2015 02:02 PM   Modules accepted: Miquel Dunn

## 2015-09-05 NOTE — Telephone Encounter (Signed)
LEFT A MESSAGE FOR PATIENT TO RETURN CALL.  NEED TO SEE IF SHE HAD HER MAMMOGRAM.  IF SO WHERE AND WHEN?

## 2015-09-08 ENCOUNTER — Encounter: Payer: Self-pay | Admitting: Internal Medicine

## 2015-09-08 ENCOUNTER — Ambulatory Visit (INDEPENDENT_AMBULATORY_CARE_PROVIDER_SITE_OTHER): Payer: Medicare Other | Admitting: Internal Medicine

## 2015-09-08 VITALS — BP 142/84 | HR 70 | Ht 65.0 in | Wt 164.8 lb

## 2015-09-08 DIAGNOSIS — R55 Syncope and collapse: Secondary | ICD-10-CM | POA: Diagnosis not present

## 2015-09-08 DIAGNOSIS — I10 Essential (primary) hypertension: Secondary | ICD-10-CM | POA: Diagnosis not present

## 2015-09-08 DIAGNOSIS — I4510 Unspecified right bundle-branch block: Secondary | ICD-10-CM | POA: Diagnosis not present

## 2015-09-08 NOTE — Patient Instructions (Signed)
Your physician recommends that you continue on your current medications as directed. Please refer to the Current Medication list given to you today. Your physician wants you to follow-up in: December, 2017 with Dr. Harrington Challenger.  You will receive a reminder letter in the mail two months in advance. If you don't receive a letter, please call our office to schedule the follow-up appointment.

## 2015-09-08 NOTE — Progress Notes (Signed)
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 female with a history of HTN  She is followed in [facility] Medicine clinic  [facility] saw Dr Carlota Raspberry there in December 2016  At that time complained of SOB with activity  Present for 10 years  Progressivly worse  Whe she was in [place] she was told that she had an electrical issure with heart  Told she might need at [facility] at some point    When in [place] had a gamut of test in May 2015  She passed out one night  Was busy cleaning alld day  When got up had horrible pain behind knee.  Stood and got dizzy  Passed out She was seen by Mariel Sleet, cardiologyist.  Extensive testing done  Seen one more time before move Pt says she had pased out multiple times as child and yong adult  Often with standing quickly  She has not had another syncopal spell since 2015  Breathing can be difficlut if going up hill, up stairs. When more fit is less of a problem   Outpatient Prescriptions Prior to Visit  Medication Sig Dispense Refill  . lisinopril (PRINIVIL,ZESTRIL) 20 MG tablet Take 1 tablet (20 mg total) by mouth daily. 90 tablet 3   No facility-administered medications prior to visit.     Allergies:   Compazine   Past Medical History  Diagnosis Date  . Allergy   . Arthritis   . Hypertension     Past Surgical History  Procedure Laterality Date  . No past surgeries       Social History:  The patient  reports that she has never smoked. She has never used smokeless tobacco. She reports that she drinks about 1.2 oz of alcohol per week. She reports that she does not use illicit drugs.   Family History:  The patient's family history includes Cancer in her father; Hypertension in her brother, father, mother, and sister; Stroke in her father.     ROS:  Please see the history of present illness. All other systems are reviewed and  Negative to the above problem except as noted.    PHYSICAL EXAM: VS:  BP 142/84 mmHg  Pulse 70  Ht 5\' 5"  (1.651 m)  Wt 164 lb 12.8 oz (74.753 kg)  BMI 27.42 kg/m2  SpO2 98%  GEN: Well nourished, well developed, in no acute distress HEENT: normal Neck: no JVD, carotid bruits, or masses Cardiac: RRR; no murmurs, rubs, or gallops,no edema  Respiratory:  clear to auscultation bilaterally, normal work of breathing GI: soft, nontender, nondistended, + BS  No hepatomegaly  MS: no deformity Moving all extremities   Skin: warm and dry, no rash Neuro:  Strength and sensation are intact Psych: euthymic mood, full affect   EKG:  EKG is ordered today.  In December 2016  SB 56 bpm  .  First degree AV block PR 218 msec  LAFB  RBBB   Lipid Panel    Component Value Date/Time   CHOL 186 07/22/2015 0954   TRIG 81 07/22/2015 0954   HDL 67 07/22/2015 0954   CHOLHDL 2.8 07/22/2015 0954   VLDL 16 07/22/2015 0954   LDLCALC 103 07/22/2015 0954  Wt Readings from Last 3 Encounters:  09/08/15 164 lb 12.8 oz (74.753 kg)  07/22/15 163 lb 9.6 oz (74.208 kg)  06/14/15 167 lb (75.751 kg)      ASSESSMENT AND PLAN:  1  Rhythm  Pt with SB  EKG with evid of conduction dz  She had this evalauted in [place]  Will get her to sign a release fro records   I am nto convinced that she his having symptomatic bradycardia  Would follow  I have discussed warning signs for this   Will f/u in December   [place] cardiologist :  Mariel Sleet at [place] [phone] 2.  Hx syncope  Last spell 2 years ago  She said she has had intermitt for years  It sounds like vagail on top of some orthostatic intolerance  I do not think related to problem1  She needs to get up slowly     I encouraged her to get a staitonary bike  She has a very sedentary job and she has R knee problems    Current medicines are  reviewed at length with the patient today.  The patient does not have concerns regarding medicines.  3  HTN  Good control    Disposition:   FU with me in December    Signed, Dorris Carnes, MD  09/08/2015 3:44 PM    [address], [place], [place]  [address] Phone: [phone]; Fax: [phone]

## 2015-10-06 DIAGNOSIS — L570 Actinic keratosis: Secondary | ICD-10-CM | POA: Diagnosis not present

## 2015-10-29 ENCOUNTER — Encounter: Payer: Self-pay | Admitting: Family Medicine

## 2016-01-08 DIAGNOSIS — H2513 Age-related nuclear cataract, bilateral: Secondary | ICD-10-CM | POA: Diagnosis not present

## 2016-01-08 DIAGNOSIS — G43B0 Ophthalmoplegic migraine, not intractable: Secondary | ICD-10-CM | POA: Diagnosis not present

## 2016-01-08 DIAGNOSIS — H43813 Vitreous degeneration, bilateral: Secondary | ICD-10-CM | POA: Diagnosis not present

## 2016-04-13 DIAGNOSIS — Z23 Encounter for immunization: Secondary | ICD-10-CM | POA: Diagnosis not present

## 2016-08-10 DIAGNOSIS — Z1231 Encounter for screening mammogram for malignant neoplasm of breast: Secondary | ICD-10-CM | POA: Diagnosis not present

## 2016-08-10 DIAGNOSIS — I10 Essential (primary) hypertension: Secondary | ICD-10-CM | POA: Diagnosis not present

## 2016-08-10 DIAGNOSIS — Z0000 Encounter for general adult medical examination without abnormal findings: Secondary | ICD-10-CM | POA: Diagnosis not present

## 2016-08-10 DIAGNOSIS — Z13220 Encounter for screening for lipoid disorders: Secondary | ICD-10-CM | POA: Diagnosis not present

## 2016-08-10 DIAGNOSIS — Z130 Encounter for screening for diseases of the blood and blood-forming organs and certain disorders involving the immune mechanism: Secondary | ICD-10-CM | POA: Diagnosis not present

## 2016-12-03 DIAGNOSIS — H6981 Other specified disorders of Eustachian tube, right ear: Secondary | ICD-10-CM | POA: Diagnosis not present

## 2016-12-09 DIAGNOSIS — H6691 Otitis media, unspecified, right ear: Secondary | ICD-10-CM | POA: Diagnosis not present

## 2016-12-09 DIAGNOSIS — J069 Acute upper respiratory infection, unspecified: Secondary | ICD-10-CM | POA: Diagnosis not present

## 2017-01-27 DIAGNOSIS — H9311 Tinnitus, right ear: Secondary | ICD-10-CM | POA: Diagnosis not present

## 2017-01-27 DIAGNOSIS — H903 Sensorineural hearing loss, bilateral: Secondary | ICD-10-CM | POA: Diagnosis not present

## 2017-01-27 DIAGNOSIS — H838X3 Other specified diseases of inner ear, bilateral: Secondary | ICD-10-CM | POA: Diagnosis not present

## 2017-02-03 DIAGNOSIS — L608 Other nail disorders: Secondary | ICD-10-CM | POA: Diagnosis not present

## 2017-02-03 DIAGNOSIS — R251 Tremor, unspecified: Secondary | ICD-10-CM | POA: Diagnosis not present

## 2017-02-03 DIAGNOSIS — I10 Essential (primary) hypertension: Secondary | ICD-10-CM | POA: Diagnosis not present

## 2017-02-03 DIAGNOSIS — Z1231 Encounter for screening mammogram for malignant neoplasm of breast: Secondary | ICD-10-CM | POA: Diagnosis not present

## 2017-02-03 DIAGNOSIS — R6889 Other general symptoms and signs: Secondary | ICD-10-CM | POA: Diagnosis not present

## 2017-02-03 DIAGNOSIS — E559 Vitamin D deficiency, unspecified: Secondary | ICD-10-CM | POA: Diagnosis not present

## 2017-02-03 DIAGNOSIS — Z8349 Family history of other endocrine, nutritional and metabolic diseases: Secondary | ICD-10-CM | POA: Diagnosis not present

## 2017-02-03 DIAGNOSIS — R350 Frequency of micturition: Secondary | ICD-10-CM | POA: Diagnosis not present

## 2017-03-29 DIAGNOSIS — H43393 Other vitreous opacities, bilateral: Secondary | ICD-10-CM | POA: Diagnosis not present

## 2017-03-29 DIAGNOSIS — H04123 Dry eye syndrome of bilateral lacrimal glands: Secondary | ICD-10-CM | POA: Diagnosis not present

## 2017-03-29 DIAGNOSIS — H2513 Age-related nuclear cataract, bilateral: Secondary | ICD-10-CM | POA: Diagnosis not present

## 2017-05-02 DIAGNOSIS — Z23 Encounter for immunization: Secondary | ICD-10-CM | POA: Diagnosis not present

## 2017-08-11 DIAGNOSIS — I10 Essential (primary) hypertension: Secondary | ICD-10-CM | POA: Diagnosis not present

## 2017-08-11 DIAGNOSIS — E7800 Pure hypercholesterolemia, unspecified: Secondary | ICD-10-CM | POA: Diagnosis not present

## 2017-08-11 DIAGNOSIS — Z0000 Encounter for general adult medical examination without abnormal findings: Secondary | ICD-10-CM | POA: Diagnosis not present

## 2017-08-11 DIAGNOSIS — Z1231 Encounter for screening mammogram for malignant neoplasm of breast: Secondary | ICD-10-CM | POA: Diagnosis not present

## 2017-08-11 DIAGNOSIS — J309 Allergic rhinitis, unspecified: Secondary | ICD-10-CM | POA: Diagnosis not present

## 2017-08-11 DIAGNOSIS — Z1159 Encounter for screening for other viral diseases: Secondary | ICD-10-CM | POA: Diagnosis not present

## 2017-08-11 DIAGNOSIS — Z13220 Encounter for screening for lipoid disorders: Secondary | ICD-10-CM | POA: Diagnosis not present

## 2017-08-11 DIAGNOSIS — Z8719 Personal history of other diseases of the digestive system: Secondary | ICD-10-CM | POA: Diagnosis not present

## 2017-08-11 DIAGNOSIS — Z780 Asymptomatic menopausal state: Secondary | ICD-10-CM | POA: Diagnosis not present

## 2017-09-14 DIAGNOSIS — M8589 Other specified disorders of bone density and structure, multiple sites: Secondary | ICD-10-CM | POA: Diagnosis not present

## 2017-09-14 DIAGNOSIS — Z1231 Encounter for screening mammogram for malignant neoplasm of breast: Secondary | ICD-10-CM | POA: Diagnosis not present

## 2017-09-14 DIAGNOSIS — Z780 Asymptomatic menopausal state: Secondary | ICD-10-CM | POA: Diagnosis not present

## 2017-10-06 DIAGNOSIS — M25461 Effusion, right knee: Secondary | ICD-10-CM | POA: Diagnosis not present

## 2017-10-06 DIAGNOSIS — M1711 Unilateral primary osteoarthritis, right knee: Secondary | ICD-10-CM | POA: Diagnosis not present

## 2017-10-06 DIAGNOSIS — Z789 Other specified health status: Secondary | ICD-10-CM | POA: Diagnosis not present

## 2017-10-06 DIAGNOSIS — M170 Bilateral primary osteoarthritis of knee: Secondary | ICD-10-CM | POA: Diagnosis not present

## 2017-10-06 DIAGNOSIS — M25561 Pain in right knee: Secondary | ICD-10-CM | POA: Diagnosis not present

## 2017-10-06 DIAGNOSIS — R269 Unspecified abnormalities of gait and mobility: Secondary | ICD-10-CM | POA: Diagnosis not present

## 2017-10-06 DIAGNOSIS — M25661 Stiffness of right knee, not elsewhere classified: Secondary | ICD-10-CM | POA: Diagnosis not present

## 2017-10-06 DIAGNOSIS — M21161 Varus deformity, not elsewhere classified, right knee: Secondary | ICD-10-CM | POA: Diagnosis not present

## 2017-10-17 DIAGNOSIS — M1711 Unilateral primary osteoarthritis, right knee: Secondary | ICD-10-CM | POA: Diagnosis not present

## 2017-10-17 DIAGNOSIS — M25561 Pain in right knee: Secondary | ICD-10-CM | POA: Diagnosis not present

## 2017-10-17 DIAGNOSIS — M25461 Effusion, right knee: Secondary | ICD-10-CM | POA: Diagnosis not present

## 2017-10-24 DIAGNOSIS — M1711 Unilateral primary osteoarthritis, right knee: Secondary | ICD-10-CM | POA: Diagnosis not present

## 2017-10-24 DIAGNOSIS — M25561 Pain in right knee: Secondary | ICD-10-CM | POA: Diagnosis not present

## 2017-11-02 DIAGNOSIS — M1711 Unilateral primary osteoarthritis, right knee: Secondary | ICD-10-CM | POA: Diagnosis not present

## 2017-11-02 DIAGNOSIS — M25561 Pain in right knee: Secondary | ICD-10-CM | POA: Diagnosis not present

## 2017-11-15 DIAGNOSIS — M25561 Pain in right knee: Secondary | ICD-10-CM | POA: Diagnosis not present

## 2017-11-15 DIAGNOSIS — M1711 Unilateral primary osteoarthritis, right knee: Secondary | ICD-10-CM | POA: Diagnosis not present

## 2017-11-21 ENCOUNTER — Ambulatory Visit (INDEPENDENT_AMBULATORY_CARE_PROVIDER_SITE_OTHER): Payer: Medicare Other

## 2017-11-21 ENCOUNTER — Ambulatory Visit (INDEPENDENT_AMBULATORY_CARE_PROVIDER_SITE_OTHER): Payer: Medicare Other | Admitting: Podiatry

## 2017-11-21 ENCOUNTER — Encounter: Payer: Self-pay | Admitting: Podiatry

## 2017-11-21 ENCOUNTER — Other Ambulatory Visit: Payer: Self-pay | Admitting: Podiatry

## 2017-11-21 VITALS — BP 153/86 | HR 63 | Resp 16

## 2017-11-21 DIAGNOSIS — M79672 Pain in left foot: Secondary | ICD-10-CM

## 2017-11-21 DIAGNOSIS — M2011 Hallux valgus (acquired), right foot: Secondary | ICD-10-CM

## 2017-11-21 DIAGNOSIS — M2012 Hallux valgus (acquired), left foot: Secondary | ICD-10-CM | POA: Diagnosis not present

## 2017-11-21 DIAGNOSIS — M2010 Hallux valgus (acquired), unspecified foot: Secondary | ICD-10-CM

## 2017-11-21 DIAGNOSIS — M779 Enthesopathy, unspecified: Secondary | ICD-10-CM | POA: Diagnosis not present

## 2017-11-21 MED ORDER — TRIAMCINOLONE ACETONIDE 10 MG/ML IJ SUSP
10.0000 mg | Freq: Once | INTRAMUSCULAR | Status: AC
  Administered 2017-11-21: 10 mg

## 2017-11-21 NOTE — Patient Instructions (Addendum)
Bunion A bunion is a bump on the base of the big toe that forms when the bones of the big toe joint move out of position. Bunions may be small at first, but they often get larger over time. The can make walking painful. What are the causes? A bunion may be caused by:  Wearing narrow or pointed shoes that force the big toe to press against the other toes.  Abnormal foot development that causes the foot to roll inward (pronate).  Changes in the foot that are caused by certain diseases, such as rheumatoid arthritis and polio.  A foot injury.  What increases the risk? The following factors may make you more likely to develop this condition:  Wearing shoes that squeeze the toes together.  Having certain diseases, such as: ? Rheumatoid arthritis. ? Polio. ? Cerebral palsy.  Having family members who have bunions.  Being born with a foot deformity, such as flat feet or low arches.  Doing activities that put a lot of pressure on the feet, such as ballet dancing.  What are the signs or symptoms? The main symptom of a bunion is a noticeable bump on the big toe. Other symptoms may include:  Pain.  Swelling around the big toe.  Redness and inflammation.  Thick or hardened skin on the big toe or between the toes.  Stiffness or loss of motion in the big toe.  Trouble with walking.  How is this diagnosed? A bunion may be diagnosed based on your symptoms, medical history, and activities. You may have tests, such as:  X-rays. These allow your health care provider to check the position of the bones in your foot and look for damage to your joint. They also help your health care provider to determine the severity of your bunion and the best way to treat it.  Joint aspiration. In this test, a sample of fluid is removed from the toe joint. This test, which may be done if you are in a lot of pain, helps to rule out diseases that cause painful swelling of the joints, such as  arthritis.  How is this treated? There is no cure for a bunion, but treatment can help to prevent a bunion from getting worse. Treatment depends on the severity of your symptoms. Your health care provider may recommend:  Wearing shoes that have a wide toe box.  Using bunion pads to cushion the affected area.  Taping your toes together to keep them in a normal position.  Placing a device inside your shoe (orthotics) to help reduce pressure on your toe joint.  Taking medicine to ease pain, inflammation, and swelling.  Applying heat or ice to the affected area.  Doing stretching exercises.  Surgery to remove scar tissue and move the toes back into their normal position. This treatment is rare.  Follow these instructions at home:  Support your toe joint with proper footwear, shoe padding, or taping as told by your health care provider.  Take over-the-counter and prescription medicines only as told by your health care provider.  If directed, apply ice to the injured area: ? Put ice in a plastic bag. ? Place a towel between your skin and the bag. ? Leave the ice on for 20 minutes, 2-3 times per day.  If directed, apply heat to the affected area before you exercise. Use the heat source that your health care provider recommends, such as a moist heat pack or a heating pad. ? Place a towel between your   skin and the heat source. ? Leave the heat on for 20-30 minutes. ? Remove the heat if your skin turns bright red. This is especially important if you are unable to feel pain, heat, or cold. You may have a greater risk of getting burned.  Do exercises as told by your health care provider.  Keep all follow-up visits as told by your health care provider. Contact a health care provider if:  Your symptoms get worse.  Your symptoms do not improve in 2 weeks. Get help right away if:  You have severe pain and trouble with walking. This information is not intended to replace advice given  to you by your health care provider. Make sure you discuss any questions you have with your health care provider. Document Released: 05/31/2005 Document Revised: 11/06/2015 Document Reviewed: 12/29/2014 Elsevier Interactive Patient Education  2018 [employer] Plantar Fasciitis (Heel Spur Syndrome) with Rehab The plantar fascia is a fibrous, ligament-like, soft-tissue structure that spans the bottom of the foot. Plantar fasciitis is a condition that causes pain in the foot due to inflammation of the tissue. SYMPTOMS   Pain and tenderness on the underneath side of the foot.  Pain that worsens with standing or walking. CAUSES  Plantar fasciitis is caused by irritation and injury to the plantar fascia on the underneath side of the foot. Common mechanisms of injury include:  Direct trauma to bottom of the foot.  Damage to a small nerve that runs under the foot where the main fascia attaches to the heel bone.  Stress placed on the plantar fascia due to bone spurs. RISK INCREASES WITH:   Activities that place stress on the plantar fascia (running, jumping, pivoting, or cutting).  Poor strength and flexibility.  Improperly fitted shoes.  Tight calf muscles.  Flat feet.  Failure to warm-up properly before activity.  Obesity. PREVENTION  Warm up and stretch properly before activity.  Allow for adequate recovery between workouts.  Maintain physical fitness:  Strength, flexibility, and endurance.  Cardiovascular fitness.  Maintain a health body weight.  Avoid stress on the plantar fascia.  Wear properly fitted shoes, including arch supports for individuals who have flat feet.  PROGNOSIS  If treated properly, then the symptoms of plantar fasciitis usually resolve without surgery. However, occasionally surgery is necessary.  RELATED COMPLICATIONS   Recurrent symptoms that may result in a chronic condition.  Problems of the lower back that are caused by compensating for  the injury, such as limping.  Pain or weakness of the foot during push-off following surgery.  Chronic inflammation, scarring, and partial or complete fascia tear, occurring more often from repeated injections.  TREATMENT  Treatment initially involves the use of ice and medication to help reduce pain and inflammation. The use of strengthening and stretching exercises may help reduce pain with activity, especially stretches of the Achilles tendon. These exercises may be performed at home or with a therapist. Your caregiver may recommend that you use heel cups of arch supports to help reduce stress on the plantar fascia. Occasionally, corticosteroid injections are given to reduce inflammation. If symptoms persist for greater than 6 months despite non-surgical (conservative), then surgery may be recommended.   MEDICATION   If pain medication is necessary, then nonsteroidal anti-inflammatory medications, such as aspirin and ibuprofen, or other minor pain relievers, such as acetaminophen, are often recommended.  Do not take pain medication within 7 days before surgery.  Prescription pain relievers may be given if deemed necessary by your caregiver. Use   only as directed and only as much as you need.  Corticosteroid injections may be given by your caregiver. These injections should be reserved for the most serious cases, because they may only be given a certain number of times.  HEAT AND COLD  Cold treatment (icing) relieves pain and reduces inflammation. Cold treatment should be applied for 10 to 15 minutes every 2 to 3 hours for inflammation and pain and immediately after any activity that aggravates your symptoms. Use ice packs or massage the area with a piece of ice (ice massage).  Heat treatment may be used prior to performing the stretching and strengthening activities prescribed by your caregiver, physical therapist, or athletic trainer. Use a heat pack or soak the injury in warm  water.  SEEK IMMEDIATE MEDICAL CARE IF:  Treatment seems to offer no benefit, or the condition worsens.  Any medications produce adverse side effects.  EXERCISES- RANGE OF MOTION (ROM) AND STRETCHING EXERCISES - Plantar Fasciitis (Heel Spur Syndrome) These exercises may help you when beginning to rehabilitate your injury. Your symptoms may resolve with or without further involvement from your physician, physical therapist or athletic trainer. While completing these exercises, remember:   Restoring tissue flexibility helps normal motion to return to the joints. This allows healthier, less painful movement and activity.  An effective stretch should be held for at least 30 seconds.  A stretch should never be painful. You should only feel a gentle lengthening or release in the stretched tissue.  RANGE OF MOTION - Toe Extension, Flexion  Sit with your right / left leg crossed over your opposite knee.  Grasp your toes and gently pull them back toward the top of your foot. You should feel a stretch on the bottom of your toes and/or foot.  Hold this stretch for 10 seconds.  Now, gently pull your toes toward the bottom of your foot. You should feel a stretch on the top of your toes and or foot.  Hold this stretch for 10 seconds. Repeat  times. Complete this stretch 3 times per day.   RANGE OF MOTION - Ankle Dorsiflexion, Active Assisted  Remove shoes and sit on a chair that is preferably not on a carpeted surface.  Place right / left foot under knee. Extend your opposite leg for support.  Keeping your heel down, slide your right / left foot back toward the chair until you feel a stretch at your ankle or calf. If you do not feel a stretch, slide your bottom forward to the edge of the chair, while still keeping your heel down.  Hold this stretch for 10 seconds. Repeat 3 times. Complete this stretch 2 times per day.   STRETCH  Gastroc, Standing  Place hands on wall.  Extend right /  left leg, keeping the front knee somewhat bent.  Slightly point your toes inward on your back foot.  Keeping your right / left heel on the floor and your knee straight, shift your weight toward the wall, not allowing your back to arch.  You should feel a gentle stretch in the right / left calf. Hold this position for 10 seconds. Repeat 3 times. Complete this stretch 2 times per day.  STRETCH  Soleus, Standing  Place hands on wall.  Extend right / left leg, keeping the other knee somewhat bent.  Slightly point your toes inward on your back foot.  Keep your right / left heel on the floor, bend your back knee, and slightly shift your weight over   the back leg so that you feel a gentle stretch deep in your back calf.  Hold this position for 10 seconds. Repeat 3 times. Complete this stretch 2 times per day.  STRETCH  Gastrocsoleus, Standing  Note: This exercise can place a lot of stress on your foot and ankle. Please complete this exercise only if specifically instructed by your caregiver.   Place the ball of your right / left foot on a step, keeping your other foot firmly on the same step.  Hold on to the wall or a rail for balance.  Slowly lift your other foot, allowing your body weight to press your heel down over the edge of the step.  You should feel a stretch in your right / left calf.  Hold this position for 10 seconds.  Repeat this exercise with a slight bend in your right / left knee. Repeat 3 times. Complete this stretch 2 times per day.   STRENGTHENING EXERCISES - Plantar Fasciitis (Heel Spur Syndrome)  These exercises may help you when beginning to rehabilitate your injury. They may resolve your symptoms with or without further involvement from your physician, physical therapist or athletic trainer. While completing these exercises, remember:   Muscles can gain both the endurance and the strength needed for everyday activities through controlled exercises.  Complete  these exercises as instructed by your physician, physical therapist or athletic trainer. Progress the resistance and repetitions only as guided.  STRENGTH - Towel Curls  Sit in a chair positioned on a non-carpeted surface.  Place your foot on a towel, keeping your heel on the floor.  Pull the towel toward your heel by only curling your toes. Keep your heel on the floor. Repeat 3 times. Complete this exercise 2 times per day.  STRENGTH - Ankle Inversion  Secure one end of a rubber exercise band/tubing to a fixed object (table, pole). Loop the other end around your foot just before your toes.  Place your fists between your knees. This will focus your strengthening at your ankle.  Slowly, pull your big toe up and in, making sure the band/tubing is positioned to resist the entire motion.  Hold this position for 10 seconds.  Have your muscles resist the band/tubing as it slowly pulls your foot back to the starting position. Repeat 3 times. Complete this exercises 2 times per day.  Document Released: 05/31/2005 Document Revised: 08/23/2011 Document Reviewed: 09/12/2008 [employer] Patient Information 2014 [employer].  

## 2017-11-21 NOTE — Progress Notes (Signed)
   Subjective:    Patient ID: Kristy Jacobs, female    DOB: [birth year], 72 y.o.   MRN: [id number]  HPI    Review of Systems  All other systems reviewed and are negative.      Objective:   Physical Exam        Assessment & Plan:

## 2017-11-21 NOTE — Progress Notes (Signed)
Subjective:   Patient ID: Kristy Jacobs, female   DOB: 72 y.o.   MRN: [id number]   HPI Patient presents stating she is had chronic pain and swelling of the dorsum and dorsal lateral aspect of the left foot with also developing pain in the plantar heel region left at the insertional point of the tendon into the calcaneus.  Patient states the top of the foot is been bothering her long time patient does not smoke likes to be active   Review of Systems  All other systems reviewed and are negative.       Objective:  Physical Exam  Constitutional: She appears well-developed and well-nourished.  Cardiovascular: Intact distal pulses.  Pulmonary/Chest: Effort normal.  Musculoskeletal: Normal range of motion.  Neurological: She is alert.  Skin: Skin is warm.  Nursing note and vitals reviewed.   Neurovascular status intact muscle strength is adequate range of motion within normal limits with patient found to have inflammation and pain of the dorsal aspect of the left foot with inflammation of the midtarsal joint and slight pain on the lateral side of the foot.  Patient is noted to have good digital perfusion and is well oriented x3 and also has mild to moderate discomfort plantar heel left     Assessment:  Dorsal tendinitis left with inflammation fluid buildup along with probable arthritis and also plantar fasciitis left over right     Plan:  H&P x-rays reviewed and today injected the dorsal tendon complex left 3 mg Kenalog 5 mg Xylocaine advised on heat ice therapy and may require other modalities in the future.  May require treatment of the plantar fascia depending on response and patient will be seen back as needed  X-ray indicated there is narrowing as of the Lisfranc's joint left with plantar spur formation and no indication of stress fracture

## 2017-11-22 DIAGNOSIS — M25561 Pain in right knee: Secondary | ICD-10-CM | POA: Diagnosis not present

## 2017-11-22 DIAGNOSIS — M1711 Unilateral primary osteoarthritis, right knee: Secondary | ICD-10-CM | POA: Diagnosis not present

## 2018-01-06 DIAGNOSIS — L309 Dermatitis, unspecified: Secondary | ICD-10-CM | POA: Diagnosis not present

## 2018-02-09 DIAGNOSIS — R05 Cough: Secondary | ICD-10-CM | POA: Diagnosis not present

## 2018-02-09 DIAGNOSIS — M722 Plantar fascial fibromatosis: Secondary | ICD-10-CM | POA: Diagnosis not present

## 2018-02-09 DIAGNOSIS — Z1231 Encounter for screening mammogram for malignant neoplasm of breast: Secondary | ICD-10-CM | POA: Diagnosis not present

## 2018-02-09 DIAGNOSIS — Z1211 Encounter for screening for malignant neoplasm of colon: Secondary | ICD-10-CM | POA: Diagnosis not present

## 2018-02-09 DIAGNOSIS — I10 Essential (primary) hypertension: Secondary | ICD-10-CM | POA: Diagnosis not present

## 2018-02-09 DIAGNOSIS — M810 Age-related osteoporosis without current pathological fracture: Secondary | ICD-10-CM | POA: Diagnosis not present

## 2018-02-09 DIAGNOSIS — Z23 Encounter for immunization: Secondary | ICD-10-CM | POA: Diagnosis not present

## 2018-02-09 DIAGNOSIS — Z124 Encounter for screening for malignant neoplasm of cervix: Secondary | ICD-10-CM | POA: Diagnosis not present

## 2018-02-09 DIAGNOSIS — M1710 Unilateral primary osteoarthritis, unspecified knee: Secondary | ICD-10-CM | POA: Diagnosis not present

## 2018-03-30 DIAGNOSIS — Z23 Encounter for immunization: Secondary | ICD-10-CM | POA: Diagnosis not present

## 2018-04-05 DIAGNOSIS — I10 Essential (primary) hypertension: Secondary | ICD-10-CM | POA: Diagnosis not present

## 2018-04-05 DIAGNOSIS — N289 Disorder of kidney and ureter, unspecified: Secondary | ICD-10-CM | POA: Diagnosis not present

## 2018-04-07 DIAGNOSIS — R194 Change in bowel habit: Secondary | ICD-10-CM | POA: Diagnosis not present

## 2018-04-07 DIAGNOSIS — R195 Other fecal abnormalities: Secondary | ICD-10-CM | POA: Diagnosis not present

## 2018-04-10 DIAGNOSIS — R195 Other fecal abnormalities: Secondary | ICD-10-CM | POA: Diagnosis not present

## 2018-05-17 DIAGNOSIS — J40 Bronchitis, not specified as acute or chronic: Secondary | ICD-10-CM | POA: Diagnosis not present

## 2018-05-17 DIAGNOSIS — I10 Essential (primary) hypertension: Secondary | ICD-10-CM | POA: Diagnosis not present

## 2018-06-02 DIAGNOSIS — R194 Change in bowel habit: Secondary | ICD-10-CM | POA: Diagnosis not present

## 2018-06-02 DIAGNOSIS — K6289 Other specified diseases of anus and rectum: Secondary | ICD-10-CM | POA: Diagnosis not present

## 2018-06-02 DIAGNOSIS — R1310 Dysphagia, unspecified: Secondary | ICD-10-CM | POA: Diagnosis not present

## 2018-06-02 DIAGNOSIS — K2930 Chronic superficial gastritis without bleeding: Secondary | ICD-10-CM | POA: Diagnosis not present

## 2018-06-13 DIAGNOSIS — K6289 Other specified diseases of anus and rectum: Secondary | ICD-10-CM | POA: Diagnosis not present

## 2018-06-13 DIAGNOSIS — K2930 Chronic superficial gastritis without bleeding: Secondary | ICD-10-CM | POA: Diagnosis not present

## 2018-06-21 DIAGNOSIS — I10 Essential (primary) hypertension: Secondary | ICD-10-CM | POA: Diagnosis not present

## 2018-06-29 DIAGNOSIS — H43812 Vitreous degeneration, left eye: Secondary | ICD-10-CM | POA: Diagnosis not present

## 2018-06-29 DIAGNOSIS — H04123 Dry eye syndrome of bilateral lacrimal glands: Secondary | ICD-10-CM | POA: Diagnosis not present

## 2018-06-29 DIAGNOSIS — H2513 Age-related nuclear cataract, bilateral: Secondary | ICD-10-CM | POA: Diagnosis not present

## 2018-07-12 ENCOUNTER — Ambulatory Visit (INDEPENDENT_AMBULATORY_CARE_PROVIDER_SITE_OTHER): Payer: Medicare Other | Admitting: Podiatry

## 2018-07-12 ENCOUNTER — Encounter: Payer: Self-pay | Admitting: Podiatry

## 2018-07-12 DIAGNOSIS — M7672 Peroneal tendinitis, left leg: Secondary | ICD-10-CM

## 2018-07-12 DIAGNOSIS — M722 Plantar fascial fibromatosis: Secondary | ICD-10-CM

## 2018-07-12 MED ORDER — TRIAMCINOLONE ACETONIDE 10 MG/ML IJ SUSP
10.0000 mg | Freq: Once | INTRAMUSCULAR | Status: AC
  Administered 2018-07-12: 10 mg

## 2018-07-12 NOTE — Progress Notes (Signed)
Subjective:   Patient ID: Kristy Jacobs, female   DOB: 73 y.o.   MRN: [id number]   HPI Patient states that she has had trouble with her left heel and pain on the top and outside of her left foot that is been sore and making it hard to wear shoe gear or walk comfortably   ROS      Objective:  Physical Exam  Neurovascular status intact with discomfort in the dorsal tendon complex left and into the lateral tendon complex left with inflammation fluid buildup noted and along the peroneal tendon group.  Patient also has discomfort in the plantar heel left     Assessment:  Peroneal tendinitis left which may also be compensatory for plantar fasciitis left and extensor tendinitis mild left     Plan:  H&P conditions reviewed I educated her on plantar fasciitis change in gait for consideration for treatment with at this point physical therapy and shoe gear modifications.  Sterile prep and I did inject the lateral side of the left foot 3 mg Kenalog 5 mg Xylocaine and explained risk associated with injection prior to doing it

## 2018-07-12 NOTE — Patient Instructions (Signed)

## 2018-11-10 ENCOUNTER — Other Ambulatory Visit: Payer: Self-pay

## 2018-11-10 ENCOUNTER — Ambulatory Visit (INDEPENDENT_AMBULATORY_CARE_PROVIDER_SITE_OTHER): Payer: Medicare Other | Admitting: Podiatry

## 2018-11-10 ENCOUNTER — Encounter: Payer: Self-pay | Admitting: Podiatry

## 2018-11-10 VITALS — Temp 97.9°F

## 2018-11-10 DIAGNOSIS — B351 Tinea unguium: Secondary | ICD-10-CM

## 2018-11-10 DIAGNOSIS — M7672 Peroneal tendinitis, left leg: Secondary | ICD-10-CM | POA: Diagnosis not present

## 2018-11-10 MED ORDER — TRIAMCINOLONE ACETONIDE 10 MG/ML IJ SUSP
10.0000 mg | Freq: Once | INTRAMUSCULAR | Status: AC
  Administered 2018-11-10: 10 mg

## 2018-11-11 NOTE — Progress Notes (Signed)
Subjective:   Patient ID: Kristy Jacobs, female   DOB: 73 y.o.   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  I did sterile prep and injected the sheath at the insertion 3 mg Dexasone Kenalog 5 mg Xylocaine and then discussed nail disease and since it is low-grade I have recommended over-the-counter topical and continued pedicure treatments

## 2019-01-03 DIAGNOSIS — I10 Essential (primary) hypertension: Secondary | ICD-10-CM | POA: Diagnosis not present

## 2019-01-08 DIAGNOSIS — M549 Dorsalgia, unspecified: Secondary | ICD-10-CM | POA: Diagnosis not present

## 2019-02-21 DIAGNOSIS — Z0000 Encounter for general adult medical examination without abnormal findings: Secondary | ICD-10-CM | POA: Diagnosis not present

## 2019-02-21 DIAGNOSIS — I10 Essential (primary) hypertension: Secondary | ICD-10-CM | POA: Diagnosis not present

## 2019-02-21 DIAGNOSIS — Z13220 Encounter for screening for lipoid disorders: Secondary | ICD-10-CM | POA: Diagnosis not present

## 2019-02-21 DIAGNOSIS — J309 Allergic rhinitis, unspecified: Secondary | ICD-10-CM | POA: Diagnosis not present

## 2019-02-21 DIAGNOSIS — Z1231 Encounter for screening mammogram for malignant neoplasm of breast: Secondary | ICD-10-CM | POA: Diagnosis not present

## 2019-02-21 DIAGNOSIS — M810 Age-related osteoporosis without current pathological fracture: Secondary | ICD-10-CM | POA: Diagnosis not present

## 2019-03-03 NOTE — Progress Notes (Addendum)
Cardiology Office Note   Date:  03/05/2019   ID:  Kristy Jacobs, DOB 02/27/1946, MRN [id number]  PCP:  [facility]  Cardiologist:   Dorris Carnes, MD    F/U of bradycardia and HTN      History of Present Illness: Kristy Jacobs is a 73 y.o. female with a history of HTN and SOB  FOllowed in [place] here from [place]  When in [place] she had a syncopal spel  Cleaing all day   Had horrible pain behind knee  Stood.  Dizzy  Passsed out   Testing was done (seen by Bartholome Bill, cardiologist)h  Pt says she had passed out multiple times as child and yong adult  Often with standing quickly  She has not had another syncopal spell since 2015  I saw the pt in 2017  REcords arrived after   Normal stress echo 2015   Pt says she gets winded easily   Different from before    Always congested   Mouth breathing  NO dizziness No syncope    Outpatient Medications Prior to Visit  Medication Sig Dispense Refill  . Ascorbic Acid (VITAMIN C PO) Take 500 mg by mouth daily.    Marland Kitchen BIOTIN PO Take 10,000 mcg by mouth daily.    . Calcium Citrate (CAL-CITRATE PO) Take 1 tablet by mouth as directed.    Marland Kitchen losartan (COZAAR) 100 MG tablet 100 mg daily.     . Multiple Vitamin (MULTIVITAMIN) tablet Take 1 tablet by mouth daily.    . Omega-3 Fatty Acids (FISH OIL PO) Take 1,000 mg by mouth daily.    . Probiotic Product (PROBIOTIC PO) Take 1 tablet by mouth daily.     No facility-administered medications prior to visit.      Allergies:   Compazine [prochlorperazine edisylate]   Past Medical History:  Diagnosis Date  . Allergy   . Arthritis   . Hypertension     Past Surgical History:  Procedure Laterality Date  . NO PAST SURGERIES       Social History:  The patient  reports that she has never smoked. She has never used smokeless tobacco. She reports current alcohol use of about 2.0 standard drinks of alcohol per week. She reports that she does not use drugs.   Family  History:  The patient's family history includes Cancer in her father; Hypertension in her brother, father, mother, and sister; Stroke in her father.    ROS:  Please see the history of present illness. All other systems are reviewed and  Negative to the above problem except as noted.    PHYSICAL EXAM: VS:  BP (!) 161/90   Pulse 74   Ht 5\' 5"  (1.651 m)   Wt 165 lb (74.8 kg)   BMI 27.46 kg/m   GEN: Well nourished, well developed, in no acute distress  HEENT: normal  Neck: no JVD, carotid bruits  Cardiac: RRR; no murmurs, rubs, or gallops,no edema  Respiratory:  clear to auscultation bilaterally, normal work of breathing GI: soft, nontender, nondistended, + BS  No hepatomegaly  MS: no deformity Moving all extremities   Skin: warm and dry, no rash Neuro:  Strength and sensation are intact Psych: euthymic mood, full affect   EKG:  EKG is ordered today.  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  Hx syncope  Pt denies recurrence     3  Hx bradycardia/conduction dz   As above    I encouraged her to get a staitonary bike  She has a very  sedentary job and she has R knee problems    Current medicines are reviewed at length with the patient today.  The patient does not have concerns regarding medicines.  3  HTN  Good control    Disposition:   FU with me in December    Signed, Dorris Carnes, MD  03/05/2019 3:02 PM    [facility] [address], [place], [place]  [address] Phone: [phone]; Fax: [phone]

## 2019-03-05 ENCOUNTER — Other Ambulatory Visit: Payer: Self-pay

## 2019-03-05 ENCOUNTER — Encounter: Payer: Self-pay | Admitting: Internal Medicine

## 2019-03-05 ENCOUNTER — Ambulatory Visit (INDEPENDENT_AMBULATORY_CARE_PROVIDER_SITE_OTHER): Payer: Medicare Other | Admitting: Internal Medicine

## 2019-03-05 VITALS — BP 161/90 | HR 74 | Ht 65.0 in | Wt 165.0 lb

## 2019-03-05 DIAGNOSIS — R001 Bradycardia, unspecified: Secondary | ICD-10-CM

## 2019-03-05 DIAGNOSIS — R0602 Shortness of breath: Secondary | ICD-10-CM | POA: Diagnosis not present

## 2019-03-05 NOTE — Patient Instructions (Signed)
Medication Instructions:  No changes If you need a refill on your cardiac medications before your next appointment, please call your pharmacy.   Lab work: none If you have labs (blood work) drawn today and your tests are completely normal, you will receive your results only by: Marland Kitchen MyChart Message (if you have MyChart) OR . A paper copy in the mail If you have any lab test that is abnormal or we need to change your treatment, we will call you to review the results.  Testing/Procedures: Your physician has requested that you have an echocardiogram. Echocardiography is a painless test that uses sound waves to create images of your heart. It provides your doctor with information about the size and shape of your heart and how well your heart's chambers and valves are working. This procedure takes approximately one hour. There are no restrictions for this procedure.  Your physician has recommended that you wear a heart monitor. Heart monitors are medical devices that record the heart's electrical activity. Doctors most often use these monitors to diagnose arrhythmias. Arrhythmias are problems with the speed or rhythm of the heartbeat. The monitor is a small, portable device. You can wear one while you do your normal daily activities. This is usually used to diagnose what is causing palpitations/syncope (passing out).   Follow-Up: At [facility], you and your health needs are our priority.  As part of our continuing mission to provide you with exceptional heart care, we have created designated Provider Care Teams.  These Care Teams include your primary Cardiologist (physician) and Advanced Practice Providers (APPs -  Physician Assistants and Nurse Practitioners) who all work together to provide you with the care you need, when you need it. You will need a follow up appointment in:  12 months.  Please call our office 2 months in advance to schedule this appointment.  You may see Dr. Dorris Carnes or one of  the following Advanced Practice Providers on your designated Care Team: Richardson Dopp, PA-C [place], [place] . Daune Perch, NP  Any Other Special Instructions Will Be Listed Below (If Applicable).

## 2019-03-07 ENCOUNTER — Telehealth: Payer: Self-pay | Admitting: *Deleted

## 2019-03-07 DIAGNOSIS — Z23 Encounter for immunization: Secondary | ICD-10-CM | POA: Diagnosis not present

## 2019-03-07 NOTE — Telephone Encounter (Signed)
3 ZIO XT long term holter monitor to be mailed to the patients home.  Instructions reviewed briefly as they are included in the monitor kit.

## 2019-03-09 ENCOUNTER — Other Ambulatory Visit: Payer: Self-pay

## 2019-03-09 ENCOUNTER — Ambulatory Visit (HOSPITAL_COMMUNITY): Payer: Medicare Other | Attending: Cardiology

## 2019-03-09 DIAGNOSIS — R0602 Shortness of breath: Secondary | ICD-10-CM | POA: Diagnosis not present

## 2019-03-09 DIAGNOSIS — R001 Bradycardia, unspecified: Secondary | ICD-10-CM | POA: Insufficient documentation

## 2019-03-12 ENCOUNTER — Other Ambulatory Visit: Payer: Self-pay

## 2019-03-12 ENCOUNTER — Encounter: Payer: Self-pay | Admitting: Podiatry

## 2019-03-12 ENCOUNTER — Ambulatory Visit (INDEPENDENT_AMBULATORY_CARE_PROVIDER_SITE_OTHER): Payer: Medicare Other | Admitting: Podiatry

## 2019-03-12 DIAGNOSIS — M7672 Peroneal tendinitis, left leg: Secondary | ICD-10-CM

## 2019-03-12 NOTE — Progress Notes (Signed)
Subjective:   Patient ID: Kristy Jacobs, female   DOB: 73 y.o.   MRN: [id number]   HPI Patient presents with a lot of pain in the outside the left foot and states that it is been making it hard to be comfortable   ROS      Objective:  Physical Exam  Neurovascular status intact with inflammation pain lateral side left foot with fluid buildup around the peroneal tendon insertion     Assessment:  Peroneal tendinitis left with inflammation fluid     Plan:  H&P reviewed condition sterile prep done and injected the tendon complex at its insertion 3 mg Dexasone Kenalog 5 g Xylocaine discussed reduced activity and patient will be seen back to recheck

## 2019-03-14 ENCOUNTER — Ambulatory Visit (INDEPENDENT_AMBULATORY_CARE_PROVIDER_SITE_OTHER): Payer: Medicare Other

## 2019-03-14 DIAGNOSIS — I440 Atrioventricular block, first degree: Secondary | ICD-10-CM | POA: Diagnosis not present

## 2019-03-14 DIAGNOSIS — R0602 Shortness of breath: Secondary | ICD-10-CM | POA: Diagnosis not present

## 2019-03-14 DIAGNOSIS — R001 Bradycardia, unspecified: Secondary | ICD-10-CM | POA: Diagnosis not present

## 2019-03-14 DIAGNOSIS — I452 Bifascicular block: Secondary | ICD-10-CM | POA: Diagnosis not present

## 2019-03-23 NOTE — Telephone Encounter (Signed)
Pt said she turned in monitor    I have not seen results from this     Processing

## 2019-03-26 DIAGNOSIS — I452 Bifascicular block: Secondary | ICD-10-CM | POA: Diagnosis not present

## 2019-03-26 DIAGNOSIS — R0602 Shortness of breath: Secondary | ICD-10-CM | POA: Diagnosis not present

## 2019-03-26 DIAGNOSIS — R001 Bradycardia, unspecified: Secondary | ICD-10-CM | POA: Diagnosis not present

## 2019-03-26 DIAGNOSIS — I440 Atrioventricular block, first degree: Secondary | ICD-10-CM | POA: Diagnosis not present

## 2019-03-29 ENCOUNTER — Other Ambulatory Visit: Payer: Self-pay | Admitting: Internal Medicine

## 2019-03-29 DIAGNOSIS — R001 Bradycardia, unspecified: Secondary | ICD-10-CM

## 2019-03-29 DIAGNOSIS — I440 Atrioventricular block, first degree: Secondary | ICD-10-CM

## 2019-03-29 DIAGNOSIS — R0602 Shortness of breath: Secondary | ICD-10-CM

## 2019-03-29 DIAGNOSIS — I452 Bifascicular block: Secondary | ICD-10-CM

## 2019-04-03 NOTE — Telephone Encounter (Signed)
I spoke with patient and reviewed echo results/review and recommendations by Dr. Harrington Challenger.  Pt reports her BP is much better when she is not inside the doctor office.  She states she pushes herself to walk a lot and will continue to do so.  Scheduled follow up with Dr. Harrington Challenger per last ov note.

## 2019-05-24 DIAGNOSIS — Z1231 Encounter for screening mammogram for malignant neoplasm of breast: Secondary | ICD-10-CM | POA: Diagnosis not present

## 2019-05-25 ENCOUNTER — Ambulatory Visit: Payer: Medicare Other | Admitting: Internal Medicine

## 2020-03-06 NOTE — Progress Notes (Addendum)
Cardiology Office Note   Date:  03/08/2020   ID:  Kristy Jacobs, DOB Jul 14, 1945, MRN [id number]  PCP:  Einar Pheasant, DO  Cardiologist:   Dorris Carnes, MD    F/U of bradycardia and HTN      History of Present Illness: Kristy Jacobs is a 74 y.o. female with a history of HTN and SOB  Moved here from [place]  While in [place] she had a syncopal spell after cleaning all day   She says at the time she had a horrible pain behind knee  She stood up and then felt dizzy/ passed out    Testing was done (seen by Bartholome Bill, cardiologist)  The pt passed out several times as a child and young adult, often after standing quickly    Last spell 2015  I saw her last in clnic  in 2020  Echo in 2020 showed LVEF 50 to 91% with diastolic dysfunction.    Monitor showed SR   Average HR 84 bpm   Since then she denies syncope  No dizziness   No CP  She does get SOB with activity   Seen by allergist   No significant allergies  Blood pressure readings at home :  120s-170s/ 60s to 80s      Outpatient Medications Prior to Visit  Medication Sig Dispense Refill  . Ascorbic Acid (VITAMIN C PO) Take 500 mg by mouth daily.    . Calcium Citrate (CAL-CITRATE PO) Take 1 tablet by mouth as directed.    . IPRATROPIUM BROMIDE NA Place 1-2 sprays into the nose 3 (three) times daily as needed.    Marland Kitchen losartan (COZAAR) 100 MG tablet 100 mg daily.     . Multiple Vitamin (MULTIVITAMIN) tablet Take 1 tablet by mouth daily.    . Probiotic Product (PROBIOTIC PO) Take 1 tablet by mouth daily.    Marland Kitchen BIOTIN PO Take 10,000 mcg by mouth daily. (Patient not taking: Reported on 03/07/2020)    . Omega-3 Fatty Acids (FISH OIL PO) Take 1,000 mg by mouth daily. (Patient not taking: Reported on 03/07/2020)     No facility-administered medications prior to visit.     Allergies:   Ace inhibitors and Compazine [prochlorperazine edisylate]   Past Medical History:  Diagnosis Date  . Allergy   . Arthritis   . Hypertension     Past  Surgical History:  Procedure Laterality Date  . NO PAST SURGERIES       Social History:  The patient  reports that she has never smoked. She has never used smokeless tobacco. She reports current alcohol use of about 2.0 standard drinks of alcohol per week. She reports that she does not use drugs.   Family History:  The patient's family history includes Cancer in her father; Hypertension in her brother, father, mother, and sister; Stroke in her father.    ROS:  Please see the history of present illness. All other systems are reviewed and  Negative to the above problem except as noted.    PHYSICAL EXAM: VS:  BP (!) 154/100   Pulse 79   Ht 5' 5.5" (1.664 m)   Wt 160 lb 6.4 oz (72.8 kg)   SpO2 93%   BMI 26.29 kg/m   GEN: Well nourished, well developed, in no acute distress  HEENT: normal  Neck: no JVD, carotid bruits  Cardiac: RRR; no signficant murmurs; no  LE edema  Respiratory:  clear to auscultation bilaterally  No wheezes GI: soft,  nontender, nondistended, + BS  No hepatomegaly  MS: no deformity Moving all extremities   Skin: warm and dry, no rash Neuro:  Strength and sensation are intact Psych: euthymic mood, full affect   EKG:  EKG is ordered today. SR 79 bpm  First degree AV block  PR 214 msc   RBBB  LAFB   Lipid Panel    Component Value Date/Time   CHOL 186 07/22/2015 0954   TRIG 81 07/22/2015 0954   HDL 67 07/22/2015 0954   CHOLHDL 2.8 07/22/2015 0954   VLDL 16 07/22/2015 0954   LDLCALC 103 07/22/2015 0954      Wt Readings from Last 3 Encounters:  03/07/20 160 lb 6.4 oz (72.8 kg)  03/05/19 165 lb (74.8 kg)  09/08/15 164 lb 12.8 oz (74.8 kg)      ASSESSMENT AND PLAN:  1  Dyspnea     Pt continues to have dyspnea   BP is elevated which may contribute   I would add HCTZ 12.5 mg for BP    Also set up for Cardiopulmonary stress test   Note pt has some joint issues but will go forward   2  HTN   As above  I have asked the pt to follow BP  Keep long   Bring  cuff to HTN clinic in4 wks along with log  CHeck BMET   3  Hx dizziness/syncope   Pt denies     4  Hx bradycardia/conduction dz     Monitor showed normal HR response   No significant pauses   Current medicines are reviewed at length with the patient today.  The patient does not have concerns regarding medicines.    Signed, Dorris Carnes, MD  03/08/2020 5:12 PM    [facility] [address], [place], [place]  [address] Phone: [phone]; Fax: [phone]

## 2020-03-07 ENCOUNTER — Ambulatory Visit: Payer: Medicare Other | Admitting: Internal Medicine

## 2020-03-07 ENCOUNTER — Other Ambulatory Visit: Payer: Self-pay

## 2020-03-07 ENCOUNTER — Encounter: Payer: Self-pay | Admitting: Internal Medicine

## 2020-03-07 VITALS — BP 154/100 | HR 79 | Ht 65.5 in | Wt 160.4 lb

## 2020-03-07 DIAGNOSIS — R0602 Shortness of breath: Secondary | ICD-10-CM

## 2020-03-07 DIAGNOSIS — I10 Essential (primary) hypertension: Secondary | ICD-10-CM

## 2020-03-07 MED ORDER — HYDROCHLOROTHIAZIDE 12.5 MG PO CAPS: 12.5000 mg | ORAL_CAPSULE | Freq: Every day | ORAL | 3 refills | Status: DC

## 2020-03-07 NOTE — Patient Instructions (Signed)
Medication Instructions:  Your physician has recommended you make the following change in your medication:  1.) start hctz 12.5 mg once daily for blood pressure  *If you need a refill on your cardiac medications before your next appointment, please call your pharmacy*   Lab Work: In one month - bmet (same day as blood pressure clinic appointment)   Testing/Procedures: Your physician has recommended that you have a cardiopulmonary stress test (CPX). CPX testing is a non-invasive measurement of heart and lung function. It replaces a traditional treadmill stress test. This type of test provides a tremendous amount of information that relates not only to your present condition but also for future outcomes. This test combines measurements of you ventilation, respiratory gas exchange in the lungs, electrocardiogram (EKG), blood pressure and physical response before, during, and following an exercise protocol.   Follow-Up: At [facility], you and your health needs are our priority.  As part of our continuing mission to provide you with exceptional heart care, we have created designated Provider Care Teams.  These Care Teams include your primary Cardiologist (physician) and Advanced Practice Providers (APPs -  Physician Assistants and Nurse Practitioners) who all work together to provide you with the care you need, when you need it.   Your next appointment:   4 week(s)  The format for your next appointment:   In Person  Provider:   Pharmacist   Other Instructions You have been referred for follow up in hypertension clinic with one of our pharmacists.

## 2020-03-11 ENCOUNTER — Emergency Department (HOSPITAL_BASED_OUTPATIENT_CLINIC_OR_DEPARTMENT_OTHER): Payer: Medicare Other

## 2020-03-11 ENCOUNTER — Other Ambulatory Visit: Payer: Self-pay

## 2020-03-11 ENCOUNTER — Encounter (HOSPITAL_BASED_OUTPATIENT_CLINIC_OR_DEPARTMENT_OTHER): Payer: Self-pay

## 2020-03-11 ENCOUNTER — Emergency Department (HOSPITAL_BASED_OUTPATIENT_CLINIC_OR_DEPARTMENT_OTHER)
Admission: EM | Admit: 2020-03-11 | Discharge: 2020-03-11 | Disposition: A | Discharge status: Home / Self Care | Payer: Medicare Other | Attending: Emergency Medicine | Admitting: Emergency Medicine

## 2020-03-11 DIAGNOSIS — E860 Dehydration: Secondary | ICD-10-CM | POA: Insufficient documentation

## 2020-03-11 DIAGNOSIS — I10 Essential (primary) hypertension: Secondary | ICD-10-CM | POA: Insufficient documentation

## 2020-03-11 DIAGNOSIS — E876 Hypokalemia: Secondary | ICD-10-CM

## 2020-03-11 DIAGNOSIS — R001 Bradycardia, unspecified: Secondary | ICD-10-CM

## 2020-03-11 DIAGNOSIS — R55 Syncope and collapse: Secondary | ICD-10-CM

## 2020-03-11 DIAGNOSIS — Z79899 Other long term (current) drug therapy: Secondary | ICD-10-CM | POA: Insufficient documentation

## 2020-03-11 LAB — CBC WITH DIFFERENTIAL/PLATELET
Abs Immature Granulocytes: 0.05 10*3/uL (ref 0.00–0.07)
Basophils Absolute: 0.1 10*3/uL (ref 0.0–0.1)
Basophils Relative: 1 %
Eosinophils Absolute: 0.1 10*3/uL (ref 0.0–0.5)
Eosinophils Relative: 1 %
HCT: 33 % — ABNORMAL LOW (ref 36.0–46.0)
Hemoglobin: 10.1 g/dL — ABNORMAL LOW (ref 12.0–15.0)
Immature Granulocytes: 1 %
Lymphocytes Relative: 15 %
Lymphs Abs: 1.5 10*3/uL (ref 0.7–4.0)
MCH: 22.9 pg — ABNORMAL LOW (ref 26.0–34.0)
MCHC: 30.6 g/dL (ref 30.0–36.0)
MCV: 74.8 fL — ABNORMAL LOW (ref 80.0–100.0)
Monocytes Absolute: 0.7 10*3/uL (ref 0.1–1.0)
Monocytes Relative: 7 %
Neutro Abs: 7.2 10*3/uL (ref 1.7–7.7)
Neutrophils Relative %: 75 %
Platelets: 383 10*3/uL (ref 150–400)
RBC: 4.41 MIL/uL (ref 3.87–5.11)
RDW: 19.1 % — ABNORMAL HIGH (ref 11.5–15.5)
WBC: 9.6 10*3/uL (ref 4.0–10.5)
nRBC: 0 % (ref 0.0–0.2)

## 2020-03-11 LAB — LIPASE, BLOOD: Lipase: 42 U/L (ref 11–51)

## 2020-03-11 LAB — BASIC METABOLIC PANEL
Anion gap: 10 (ref 5–15)
BUN: 19 mg/dL (ref 8–23)
CO2: 22 mmol/L (ref 22–32)
Calcium: 8.1 mg/dL — ABNORMAL LOW (ref 8.9–10.3)
Chloride: 101 mmol/L (ref 98–111)
Creatinine, Ser: 1.09 mg/dL — ABNORMAL HIGH (ref 0.44–1.00)
GFR calc Af Amer: 59 mL/min — ABNORMAL LOW (ref >60)
GFR calc non Af Amer: 51 mL/min — ABNORMAL LOW (ref >60)
Glucose, Bld: 115 mg/dL — ABNORMAL HIGH (ref 70–99)
Potassium: 3.2 mmol/L — ABNORMAL LOW (ref 3.5–5.1)
Sodium: 133 mmol/L — ABNORMAL LOW (ref 135–145)

## 2020-03-11 LAB — HEPATIC FUNCTION PANEL
ALT: 15 U/L (ref 0–44)
AST: 22 U/L (ref 15–41)
Albumin: 3.6 g/dL (ref 3.5–5.0)
Alkaline Phosphatase: 67 U/L (ref 38–126)
Bilirubin, Direct: <0.1 mg/dL (ref 0.0–0.2)
Total Bilirubin: 0.5 mg/dL (ref 0.3–1.2)
Total Protein: 6.9 g/dL (ref 6.5–8.1)

## 2020-03-11 LAB — TROPONIN I (HIGH SENSITIVITY): Troponin I (High Sensitivity): 3 ng/L (ref <18)

## 2020-03-11 MED ORDER — POTASSIUM CHLORIDE CRYS ER 20 MEQ PO TBCR
40.0000 meq | EXTENDED_RELEASE_TABLET | Freq: Once | ORAL | Status: AC
  Administered 2020-03-11: 40 meq via ORAL
  Filled 2020-03-11: qty 2

## 2020-03-11 MED ORDER — SODIUM CHLORIDE 0.9 % IV BOLUS
1000.0000 mL | Freq: Once | INTRAVENOUS | Status: AC
  Administered 2020-03-11: 1000 mL via INTRAVENOUS

## 2020-03-11 NOTE — ED Notes (Signed)
ED Provider at bedside. 

## 2020-03-11 NOTE — ED Provider Notes (Signed)
[facility] EMERGENCY DEPARTMENT Provider Note   CSN: [id number] Arrival date & time: 03/11/20  1155     History Chief Complaint  Patient presents with   Loss of Consciousness    Kristy Jacobs is a 74 y.o. female.  The history is provided by the patient.  Loss of Consciousness Episode history:  Single Most recent episode:  Today Progression:  Resolved Chronicity:  New Context: sitting down   Witnessed: yes   Relieved by:  Nothing Worsened by:  Nothing Associated symptoms: weakness   Associated symptoms: no anxiety, no chest pain, no confusion, no diaphoresis, no difficulty breathing, no dizziness, no fever, no focal sensory loss, no focal weakness, no headaches, no malaise/fatigue, no nausea, no palpitations, no recent fall, no recent injury, no recent surgery, no rectal bleeding, no seizures, no shortness of breath, no visual change and no vomiting        Past Medical History:  Diagnosis Date   Allergy    Arthritis    Hypertension     Patient Active Problem List   Diagnosis Date Noted   Allergy    Arthritis    Hypertension     Past Surgical History:  Procedure Laterality Date   NO PAST SURGERIES       OB History   No obstetric history on file.     Family History  Problem Relation Age of Onset   Hypertension Mother    Hypertension Father    Cancer Father    Stroke Father    Hypertension Brother    Hypertension Sister     Social History   Tobacco Use   Smoking status: Never Smoker   Smokeless tobacco: Never Used  Substance Use Topics   Alcohol use: Yes    Alcohol/week: 2.0 standard drinks    Types: 2 Glasses of wine per week   Drug use: No    Home Medications Prior to Admission medications   Medication Sig Start Date End Date Taking? Authorizing Provider  Ascorbic Acid (VITAMIN C PO) Take 500 mg by mouth daily.    [provider]  Calcium Citrate (CAL-CITRATE PO) Take 1 tablet by mouth as directed.     [provider]  hydrochlorothiazide (MICROZIDE) 12.5 MG capsule Take 1 capsule (12.5 mg total) by mouth daily. 03/07/20   Fay Records, MD  IPRATROPIUM BROMIDE NA Place 1-2 sprays into the nose 3 (three) times daily as needed.    [provider]  losartan (COZAAR) 100 MG tablet 100 mg daily.  05/29/18   [provider]  Multiple Vitamin (MULTIVITAMIN) tablet Take 1 tablet by mouth daily.    [provider]  Probiotic Product (PROBIOTIC PO) Take 1 tablet by mouth daily.    [provider]    Allergies    Ace inhibitors and Compazine [prochlorperazine edisylate]  Review of Systems   Review of Systems  Constitutional: Negative for chills, diaphoresis, fever and malaise/fatigue.  HENT: Negative for ear pain and sore throat.   Eyes: Negative for pain and visual disturbance.  Respiratory: Negative for cough and shortness of breath.   Cardiovascular: Positive for syncope. Negative for chest pain and palpitations.  Gastrointestinal: Negative for abdominal pain, nausea and vomiting.  Genitourinary: Negative for dysuria and hematuria.  Musculoskeletal: Negative for arthralgias and back pain.  Skin: Negative for color change and rash.  Neurological: Positive for syncope and weakness. Negative for dizziness, focal weakness, seizures and headaches.  Psychiatric/Behavioral: Negative for confusion.  All other systems  reviewed and are negative.   Physical Exam Updated Vital Signs  ED Triage Vitals  Enc Vitals Group     BP 03/11/20 1207 123/63     Pulse Rate 03/11/20 1207 (!) 56     Resp 03/11/20 1207 14     Temp 03/11/20 1207 (!) 97.5 F (36.4 C)     Temp Source 03/11/20 1207 Oral     SpO2 03/11/20 1156 100 %     Weight 03/11/20 1159 155 lb (70.3 kg)     Height 03/11/20 1159 5' 5.5" (1.664 m)     Head Circumference --      Peak Flow --      Pain Score 03/11/20 1159 0     Pain Loc --      Pain Edu? --      Excl. in [place]? --    \  Physical  Exam Vitals and nursing note reviewed.  Constitutional:      General: She is not in acute distress.    Appearance: She is well-developed. She is not ill-appearing.  HENT:     Head: Normocephalic and atraumatic.     Mouth/Throat:     Mouth: Mucous membranes are moist.  Eyes:     Extraocular Movements: Extraocular movements intact.     Conjunctiva/sclera: Conjunctivae normal.     Pupils: Pupils are equal, round, and reactive to light.  Cardiovascular:     Rate and Rhythm: Normal rate and regular rhythm.     Pulses: Normal pulses.     Heart sounds: Normal heart sounds. No murmur heard.   Pulmonary:     Effort: Pulmonary effort is normal. No respiratory distress.     Breath sounds: Normal breath sounds.  Abdominal:     Palpations: Abdomen is soft.     Tenderness: There is no abdominal tenderness.  Musculoskeletal:     Cervical back: Normal range of motion and neck supple.  Skin:    General: Skin is warm and dry.     Capillary Refill: Capillary refill takes less than 2 seconds.  Neurological:     General: No focal deficit present.     Mental Status: She is alert and oriented to person, place, and time.     Cranial Nerves: No cranial nerve deficit.     Sensory: No sensory deficit.     Motor: No weakness.     Coordination: Coordination normal.     Comments: 5+ out of 5 strength throughout, normal sensation, no drift, normal finger-to-nose finger  Psychiatric:        Mood and Affect: Mood normal.     ED Results / Procedures / Treatments   Labs (all labs ordered are listed, but only abnormal results are displayed) Labs Reviewed  CBC WITH DIFFERENTIAL/PLATELET - Abnormal; Notable for the following components:      Result Value   Hemoglobin 10.1 (*)    HCT 33.0 (*)    MCV 74.8 (*)    MCH 22.9 (*)    RDW 19.1 (*)    All other components within normal limits  BASIC METABOLIC PANEL - Abnormal; Notable for the following components:   Sodium 133 (*)    Potassium 3.2 (*)     Glucose, Bld 115 (*)    Creatinine, Ser 1.09 (*)    Calcium 8.1 (*)    GFR calc non Af Amer 51 (*)    GFR calc Af Amer 59 (*)    All other components within normal limits  LIPASE, BLOOD  HEPATIC FUNCTION PANEL  TROPONIN I (HIGH SENSITIVITY)    EKG EKG Interpretation  Date/Time:  Tuesday March 11 2020 12:06:05 EDT Ventricular Rate:  58 PR Interval:    QRS Duration: 158 QT Interval:  464 QTC Calculation: 456 R Axis:   -27 Text Interpretation: Sinus rhythm Prolonged PR interval Right bundle branch block Confirmed by Lennice Sites [phone]) on 03/11/2020 12:23:09 PM   Radiology DG Chest 2 View  Result Date: 03/11/2020 CLINICAL DATA:  Syncope.  Dizziness. EXAM: CHEST - 2 VIEW COMPARISON:  06/14/2015 FINDINGS: Midline trachea. Borderline cardiomegaly. Atherosclerosis in the transverse aorta. No pleural effusion or pneumothorax. No congestive failure. Clear lungs. Numerous leads and wires project over the chest. IMPRESSION: No acute cardiopulmonary disease. Aortic Atherosclerosis (ICD10-I70.0). Electronically Signed   By: Abigail Miyamoto M.D.   On: 03/11/2020 13:06   CT Head Wo Contrast  Result Date: 03/11/2020 CLINICAL DATA:  Syncope, simple, normal neuro exam. EXAM: CT HEAD WITHOUT CONTRAST TECHNIQUE: Contiguous axial images were obtained from the base of the skull through the vertex without intravenous contrast. COMPARISON:  No pertinent prior exams are available for comparison. FINDINGS: Brain: Mild generalized cerebral atrophy. There is no acute intracranial hemorrhage. No demarcated cortical infarct. No extra-axial fluid collection. No evidence of intracranial mass. No midline shift. Vascular: No hyperdense vessel. Skull: Normal. Negative for fracture or focal lesion. Sinuses/Orbits: Visualized orbits show no acute finding. No significant paranasal sinus disease at the imaged levels. Trace right mastoid fluid. IMPRESSION: No evidence of acute intracranial abnormality. Mild generalized  cerebral atrophy. Trace right mastoid effusion. Electronically Signed   By: Kellie Simmering DO   On: 03/11/2020 12:56    Procedures Procedures (including critical care time)  Medications Ordered in ED Medications  sodium chloride 0.9 % bolus 1,000 mL (1,000 mLs Intravenous New Bag/Given 03/11/20 1330)  potassium chloride SA (KLOR-CON) CR tablet 40 mEq (40 mEq Oral Given 03/11/20 1343)    ED Course  I have reviewed the triage vital signs and the nursing notes.  Pertinent labs & imaging results that were available during my care of the patient were reviewed by me and considered in my medical decision making (see chart for details).    MDM Rules/Calculators/A&P                          Santrice Muzio is a 74 year old female with history of hypertension, allergy, arthritis who presents to the ED after syncopal event.  Unremarkable vitals.  No fever.  EKG shows sinus rhythm with prolonged PR first-degree heart block.  Has a right bundle branch.  This is unchanged from previous EKGs.  Had increased dose of her hydrochlorothiazide the last 2 days.  She was working at the [employer] today and was feeling a little bit lightheaded and then she felt sweaty and nauseous sat down on the chair and slumped over.  She does not think that she hit her head and that someone caught her.  She has had episodes like this in the past.  Overall neurologically intact.  Possibly vasovagal/orthostatic process especially in the setting of increased blood pressure medication over the last 2 days.  Seems less likely a cardiac arrhythmia given EKG unchanged from prior and based off the history and physical.  She does follow with cardiology and has an outpatient echocardiogram already scheduled, would likely benefit from an event monitor as well.  However, will continue cardiac monitoring, head CT and basic labs.  If she remains asymptomatic and lab work unremarkable anticipate discharge to home.  No chest pain, no shortness of  breath, no abdominal pain.  Lab work overall unremarkable.  Mild elevation in creatinine to 1.09.  Potassium at 3.2.  Given potassium repletion.  CT scan unremarkable.  No significant leukocytosis or anemia.  Opponent normal.  Chest x-ray without pneumonia, pneumothorax, pleural effusion.  CT scan of the head normal.  Patient felt better after IV fluids.  Suspect orthostatic/vasovagal event with some dehydration.  Recommend discontinuing hydrochlorothiazide at this time blood pressure has been well within normal range.  Already has follow-up with cardiology for echocardiogram and stress test and recommend that she discuss with them to wear an event/Holter monitor.  Told her return to the ED if symptoms worsen or she has another event as she may need admission for more immediate telemetry/echo.  Discharged in good condition.  Understands return precautions.  This chart was dictated using voice recognition software.  Despite best efforts to proofread,  errors can occur which can change the documentation meaning.     Final Clinical Impression(s) / ED Diagnoses Final diagnoses:  Syncope, unspecified syncope type  Dehydration  Hypokalemia    Rx / DC Orders ED Discharge Orders    None       Lennice Sites, DO 03/11/20 1408

## 2020-03-11 NOTE — Discharge Instructions (Addendum)
Recommend discontinuing hydrochlorothiazide.  Follow-up with primary care or cardiology about possibly wearing a Holter monitor and continue your work-up with cardiology with stress test/echo.  Please return if symptoms recur or worsen.

## 2020-03-11 NOTE — Telephone Encounter (Signed)
Spoke to pt   Reviewed ED visit    Recomm:  Of course stop HCTZ Cut cozaar in 1/2  Take 1/2 bid   Follow BP   Keep log   Measure a couple times per day Bring cuff and log to HTN cliic    Stay hydrated      Please  see if appt  can be moved up to a few wks (pt says right now itis at end of October)  Send 3 day monitor to patient   R/O signif bradycardia with syncope  Pt to get lipids from PCP   CXR showed atherosclerosis of aorta

## 2020-03-11 NOTE — ED Triage Notes (Signed)
Pt arrives via [facility] from work, pt became dizzy and lightheaded. Pt has recently had her HCTZ increased by PCP this is the second day of the increase. Per EMS pt sat down, did not fall. Upcoming stress test this coming Thursday.

## 2020-03-11 NOTE — ED Notes (Signed)
Spoke with pt about orthostatic vitals. PT showed concern with ability to stand up.

## 2020-03-13 MED ORDER — LOSARTAN POTASSIUM 25 MG PO TABS: 25.0000 mg | ORAL_TABLET | Freq: Two times a day (BID) | ORAL | 3 refills | Status: DC

## 2020-03-13 NOTE — Telephone Encounter (Signed)
Message from Dr. Harrington Challenger clarifying dose of losartan.  25 mg bid    FOllow BP closely  If BP increases above 150/ increase to 50 bid     ________________________________________________________ I spoke with patient.  She is overall better but still intermittently getting dizzy.  She has stopped hctz and cut losartan to 50 mg BID.  Adv her to decrease further to 25 mg BID, check BP twice daily, about 3-4 hours after dose of medication.  If >150/ increase to 50 mg twice daily.  New prescription sent to [facility] for lower dose.  Adv to stay hydrated.  Lab work from PCP (lipids) were requested this am.  Will watch for them for Dr. Harrington Challenger to review.

## 2020-03-15 ENCOUNTER — Ambulatory Visit (INDEPENDENT_AMBULATORY_CARE_PROVIDER_SITE_OTHER): Payer: Medicare Other

## 2020-03-15 DIAGNOSIS — R55 Syncope and collapse: Secondary | ICD-10-CM

## 2020-03-21 NOTE — Telephone Encounter (Signed)
Message to medical records requesting copy of recent labs from PCP (Lipids).

## 2020-03-24 NOTE — Telephone Encounter (Signed)
I replied to patients message.

## 2020-03-25 ENCOUNTER — Encounter: Payer: Self-pay | Admitting: Pharmacist

## 2020-03-25 ENCOUNTER — Other Ambulatory Visit: Payer: Self-pay

## 2020-03-25 ENCOUNTER — Ambulatory Visit (INDEPENDENT_AMBULATORY_CARE_PROVIDER_SITE_OTHER): Payer: Medicare Other | Admitting: Pharmacist

## 2020-03-25 ENCOUNTER — Other Ambulatory Visit: Payer: Medicare Other | Admitting: *Deleted

## 2020-03-25 VITALS — BP 138/82 | HR 88

## 2020-03-25 DIAGNOSIS — R0602 Shortness of breath: Secondary | ICD-10-CM

## 2020-03-25 DIAGNOSIS — I10 Essential (primary) hypertension: Secondary | ICD-10-CM

## 2020-03-25 LAB — BASIC METABOLIC PANEL
BUN/Creatinine Ratio: 13 (ref 12–28)
BUN: 13 mg/dL (ref 8–27)
CO2: 24 mmol/L (ref 20–29)
Calcium: 9.5 mg/dL (ref 8.7–10.3)
Chloride: 102 mmol/L (ref 96–106)
Creatinine, Ser: 1.01 mg/dL — ABNORMAL HIGH (ref 0.57–1.00)
GFR calc Af Amer: 64 mL/min/{1.73_m2} (ref >59)
GFR calc non Af Amer: 56 mL/min/{1.73_m2} — ABNORMAL LOW (ref >59)
Glucose: 101 mg/dL — ABNORMAL HIGH (ref 65–99)
Potassium: 4 mmol/L (ref 3.5–5.2)
Sodium: 139 mmol/L (ref 134–144)

## 2020-03-25 NOTE — Patient Instructions (Addendum)
It was nice to meet you today!  Your blood pressure goal is < 130/12mmHg  Continue taking losartan 25mg  twice daily  Take an extra tablet if your systolic blood pressure is > 150  Limit your daily sodium intake to < 2,000mg  daily  Follow up in clinic in 3 weeks - bring your home readings and your blood pressure cuff for calibration

## 2020-03-25 NOTE — Progress Notes (Signed)
Patient ID: Kristy Jacobs                 DOB: 10-31-1945                      MRN: [id number]     HPI: Elvie Palomo is a 74 y.o. female referred by Dr. Harrington Challenger to HTN clinic. PMH is significant for HTN, SOB with activity, and syncopal spells, often after standing quickly with last spell in 2015. She was see in clinic 03/07/20 and BP was elevated at 154/100. Home BP readings had ranged 120-170s/60-80s. She was experiencing dyspnea with elevated BP as potential contributing factor, denied syncope at the time. She was started on HCTZ 12.5mg  daily and set up for cardiopulmonary stress test.   4 days later, she went to the ED with syncopal event. Reported working at the [employer] and feeling a little bit lightheaded, then she felt sweaty and nauseous, sat down on the chair and slumped over. She does not think that she hit her head and that someone caught her.  She has had episodes like this in the past. Seemed less likely a cardiac arrhythmia given EKG unchanged from prior and based off the history and physical. CT of the head was normal, pt felt better after IV fluids. Suspected orthostatic/vasovagal event with some dehydration. HCTZ was discontinued since BP was in the normal range. Recommended discussion for event/holter monitor. She called cardiology office as well and was advised to cut losartan in half and take 25mg  BID and to continue monitoring BP at home. She was also given a 3 day Zio patch monitor.  Pt presents today in good spirits. Reports tolerating lower dose of losartan well. Has not had any episodes of syncope since her visit to the ED. Has been checking her BP at home 1-2x per day but will wait 5 mins in between first reading and then check BP again. 2nd reading consistently lower than 1st reading. List of 2nd readings show 16 at goal, 9 above goal (typically 427C systolic, 2 readings in the 140s and 1 reading in the low 160s when she was stressed). She also reports white coat HTN.  Typically  takes Tylenol if she has a headache but does occasionally take an ibuprofen. Has deal with congestion for years - takes Mucinex for this, occasionally a Sudafed. Discussed that NSAIDs and Sudafed can increase BP. She went to an allergist a few weeks ago and was only found to be allergic to [facility] trees. She was given a nasal spray which has helped with her congestion.   She is bothered by her SOB that occurs while walking around her house. This is worse with high humidity or when walking uphill. Has had chest x-rays done before that didn't show any problems. Has cardiopulmonary test scheduled in the next few weeks. Still tries to stay active with walking, yoga, and using stationary bike.  Current HTN meds: losartan 25mg  BID Previously tried: HCTZ 12.5mg  daily - syncopal episode, hypokalemia to 3.2, ACEi - cough BP goal: <130/82mmHg - ok if a bit above this due to syncopal spells in past  Family History: The patient's family history includes Cancer in her father; Hypertension in her brother, father, mother, and sister; Stroke in her father.  Social History: The patient  reports that she has never smoked. She has never used smokeless tobacco. She reports current alcohol use of about 2.0 standard drinks of alcohol per week. She reports that she does not  use drugs.   Diet: 1 cup of coffee each day. Likes fruit and salads. Tries to watch her salt intake. Limits red meat, eats more chicken or fish.  Exercise: Walks, does yoga, has a stationary bike.  Wt Readings from Last 3 Encounters:  03/11/20 155 lb (70.3 kg)  03/07/20 160 lb 6.4 oz (72.8 kg)  03/05/19 165 lb (74.8 kg)   BP Readings from Last 3 Encounters:  03/11/20 125/87  03/07/20 (!) 154/100  03/05/19 (!) 161/90   Pulse Readings from Last 3 Encounters:  03/11/20 67  03/07/20 79  03/05/19 74    Renal function: Estimated Creatinine Clearance: 46.4 mL/min (A) (by C-G formula based on SCr of 1.09 mg/dL (H)).  Past Medical History:   Diagnosis Date  . Allergy   . Arthritis   . Hypertension     Current Outpatient Medications on File Prior to Visit  Medication Sig Dispense Refill  . Ascorbic Acid (VITAMIN C PO) Take 500 mg by mouth daily.    . Calcium Citrate (CAL-CITRATE PO) Take 1 tablet by mouth as directed.    . IPRATROPIUM BROMIDE NA Place 1-2 sprays into the nose 3 (three) times daily as needed.    Marland Kitchen losartan (COZAAR) 25 MG tablet Take 1 tablet (25 mg total) by mouth 2 (two) times daily. 180 tablet 3  . Multiple Vitamin (MULTIVITAMIN) tablet Take 1 tablet by mouth daily.    . Probiotic Product (PROBIOTIC PO) Take 1 tablet by mouth daily.     No current facility-administered medications on file prior to visit.    Allergies  Allergen Reactions  . Ace Inhibitors Cough  . Compazine [Prochlorperazine Edisylate] Nausea And Vomiting     Assessment/Plan:  1. Hypertension - BP a bit elevated above goal <130/70mmHg, although pt reports white coat HTN. 2/3 of home BP readings are at goal. Will continue losartan 25mg  BID (ok to take extra tab if SBP is >150) as pt is sensitive to medication changes and experienced an episode of syncope after starting low dose HCTZ. She will continue to monitor her BP at home and bring her log as well as home BP cuff for calibration to next visit in 3 weeks. If white coat HTN confirmed and cuff is accurate, will adjust medications using home readings instead. Discussed relationship between NSAIDs and Sudafed and higher BP readings, as well as working towards < 2,000mg  daily sodium intake. Also checking repeat BMP today since K was low during ED visit while still taking HCTZ - anticipate this will have normalized with thiazide d/c. Still awaiting results of Zio monitor and has cardiopulmonary stress test scheduled in a few weeks; hopefully will gain additional insight into cause of pt's syncopal episodes.  Rembert Browe E. Orvill Coulthard, PharmD, BCACP, [place] [address], [place], [place] [address] Phone: [phone]; Fax: [phone] 03/25/2020 10:23 AM

## 2020-04-08 ENCOUNTER — Ambulatory Visit (HOSPITAL_COMMUNITY): Payer: Medicare Other | Attending: Internal Medicine

## 2020-04-08 ENCOUNTER — Other Ambulatory Visit: Payer: Self-pay

## 2020-04-08 ENCOUNTER — Other Ambulatory Visit (HOSPITAL_COMMUNITY): Payer: Self-pay | Admitting: *Deleted

## 2020-04-08 DIAGNOSIS — R0602 Shortness of breath: Secondary | ICD-10-CM | POA: Insufficient documentation

## 2020-04-08 DIAGNOSIS — I10 Essential (primary) hypertension: Secondary | ICD-10-CM

## 2020-04-09 ENCOUNTER — Other Ambulatory Visit: Payer: Medicare Other

## 2020-04-09 ENCOUNTER — Ambulatory Visit: Payer: Medicare Other

## 2020-04-15 ENCOUNTER — Ambulatory Visit: Payer: Medicare Other

## 2020-04-17 ENCOUNTER — Ambulatory Visit (INDEPENDENT_AMBULATORY_CARE_PROVIDER_SITE_OTHER): Payer: Medicare Other | Admitting: Pharmacist

## 2020-04-17 ENCOUNTER — Other Ambulatory Visit: Payer: Self-pay

## 2020-04-17 VITALS — BP 168/95 | HR 70

## 2020-04-17 DIAGNOSIS — I10 Essential (primary) hypertension: Secondary | ICD-10-CM

## 2020-04-17 MED ORDER — VALSARTAN 320 MG PO TABS: ORAL_TABLET | ORAL | 11 refills | Status: DC

## 2020-04-17 NOTE — Patient Instructions (Addendum)
It was nice to see you!  Your blood pressure goal is < 130/48mmHg  Stop taking losartan  Start taking valsartan. I sent in a prescription for a 320mg  tablet. For the first week, cut this medication in half and take 160mg  once a day. Monitor your blood pressure. As long as you feel ok on the medicine after 1 week, increase your dose up to the full tablet (320mg ) once a day.  Monitor your blood pressure once a day   Follow up in clinic for blood pressure check on Monday, November 29th at 10:30am

## 2020-04-17 NOTE — Progress Notes (Signed)
Patient ID: Kristy Jacobs                 DOB: 06-14-1946                      MRN: [id number]     HPI: Kristy Jacobs is a 74 y.o. female referred by Dr. Harrington Challenger to HTN clinic. PMH is significant for HTN, SOB with activity, and syncopal spells, often after standing quickly with last spell in 2015. She was see in clinic 03/07/20 and BP was elevated at 154/100. Home BP readings had ranged 120-170s/60-80s. She was experiencing dyspnea with elevated BP as potential contributing factor, denied syncope at the time. She was started on HCTZ 12.5mg  daily and set up for cardiopulmonary stress test.   4 days later, she went to the ED with syncopal event. She has had episodes like this in the past. Seemed less likely a cardiac arrhythmia given EKG unchanged from prior and based off the history and physical. CT of the head was normal, pt felt better after IV fluids. Suspected orthostatic/vasovagal event with some dehydration. HCTZ was discontinued since BP was in the normal range. Recommended discussion for event/holter monitor. She called cardiology office as well and was advised to cut losartan in half and take 25mg  BID and to continue monitoring BP at home. She was also given a 3 day Zio patch monitor.  At last visit, her K had normalized off of her HCTZ. About 2/3 of home BP readings were at goal and pt reported white coat HTN. She was continued on losartan 25mg  BID and advised to bring in home cuff and reading to today's visit. Zio patch 03/26/20 showed no arrhythmias, pt was encouraged to stay hydrated and monitor BP. She also underwent cardiopulmonary exercise testing 04/08/20 which revealed a low normal functional capacity with resting spirometry suggesting severe obstructive/restrictive lung disease - referral to pulm was recommended. Severe hypertensive response to exercise was also noted (baseline BP 162/88, peak BP 230/82).  Home BP readings have remained elevated ~75% of the time. She has been splitting her  losartan dose BID and checks her BP a few times a day, typically before she's due to take a dose of losartan. Her BP does increase in clinic, consistent with white coat HTN diagnosis. She has been avoiding NSAIDs and Sudafed. Occasionally uses Mucinex for chronic congestion. Allergist has noted that she is only allergic to [facility] trees. She was given a nasal spray which has helped with her congestion. No other episodes of dizziness or syncope at home. Still feels SOB when walking around her house, also worse when walking uphill or in high humidity. Her husband takes amlodipine for his BP which has been working well for him.  BP readings - Home cuff: 168/97, recheck 178/92 Clinic cuff: 142/100 clinic, recheck 168/95  Current HTN meds: losartan 25mg  BID Previously tried: HCTZ 12.5mg  daily - syncopal episode, hypokalemia to 3.2, ACEi - cough BP goal: <130/71mmHg - ok if a bit above this due to syncopal spells in past  Family History: The patient's family history includes Cancer in her father; Hypertension in her brother, father, mother, and sister; Stroke in her father.  Social History: The patient  reports that she has never smoked. She has never used smokeless tobacco. She reports current alcohol use of about 2.0 standard drinks of alcohol per week. She reports that she does not use drugs.   Diet: 1 cup of coffee each day. Likes fruit and salads. Tries  to watch her salt intake. Limits red meat, eats more chicken or fish.  Exercise: Walks, does yoga, has a stationary bike.  Home BP readings: typically 536U systolic. Measures before taking losartan dose in AM and PM ~25% of readings at goal over the past week, the rest are 130-150s/60-80 ~10% of readings at goal from the prior week ~50% of readings at goal from 10/12-10/16 1/2 at goal from 10/12 - 10/16  Wt Readings from Last 3 Encounters:  03/11/20 155 lb (70.3 kg)  03/07/20 160 lb 6.4 oz (72.8 kg)  03/05/19 165 lb (74.8 kg)   BP Readings  from Last 3 Encounters:  03/25/20 138/82  03/11/20 125/87  03/07/20 (!) 154/100   Pulse Readings from Last 3 Encounters:  03/25/20 88  03/11/20 67  03/07/20 79    Renal function: CrCl cannot be calculated (Patient's most recent lab result is older than the maximum 21 days allowed.).  Past Medical History:  Diagnosis Date   Allergy    Arthritis    Hypertension     Current Outpatient Medications on File Prior to Visit  Medication Sig Dispense Refill   Ascorbic Acid (VITAMIN C PO) Take 500 mg by mouth daily.     Calcium Citrate (CAL-CITRATE PO) Take 1 tablet by mouth as directed.     IPRATROPIUM BROMIDE NA Place 1-2 sprays into the nose 3 (three) times daily as needed.     losartan (COZAAR) 25 MG tablet Take 1 tablet (25 mg total) by mouth 2 (two) times daily. 180 tablet 3   Multiple Vitamin (MULTIVITAMIN) tablet Take 1 tablet by mouth daily.     Probiotic Product (PROBIOTIC PO) Take 1 tablet by mouth daily.     No current facility-administered medications on file prior to visit.    Allergies  Allergen Reactions   Ace Inhibitors Cough   Compazine [Prochlorperazine Edisylate] Nausea And Vomiting     Assessment/Plan:  1. Hypertension - BP remains elevated in clinic due to white coat HTN, although majority of home readings remain elevated as well. Will change losartan to valsartan for more potent BP lowering. Pt will start at 160mg  daily for the first week, then increase her dose to 320mg  daily if tolerating the lower dose well. She'll take her dose before bed and will monitor BP once a day in the AM when she wakes up. F/u in office in 4 weeks for BP check and BMET.  Will also forward note to Dr Harrington Challenger to review cardiopulmonary exercise test. Results were reviewed today with pt per Dr Bensimhon's analysis, however pt wishes to hear analysis from Dr Harrington Challenger and also likely needs referral to pulmonology as well.  Heli Dino E. Dilraj Killgore, PharmD, BCACP, [place] [address] [place], [place] [address] Phone: [phone]; Fax: [phone] 04/17/2020 7:33 AM

## 2020-04-21 ENCOUNTER — Other Ambulatory Visit: Payer: Self-pay | Admitting: *Deleted

## 2020-04-21 DIAGNOSIS — R0602 Shortness of breath: Secondary | ICD-10-CM

## 2020-04-21 NOTE — Progress Notes (Signed)
Referral placed to Pulmonary per Dr. Harrington Challenger (See CPX results).

## 2020-05-12 ENCOUNTER — Ambulatory Visit (INDEPENDENT_AMBULATORY_CARE_PROVIDER_SITE_OTHER): Payer: Medicare Other | Admitting: Pharmacist

## 2020-05-12 ENCOUNTER — Other Ambulatory Visit: Payer: Self-pay

## 2020-05-12 VITALS — BP 158/82 | HR 71

## 2020-05-12 DIAGNOSIS — I10 Essential (primary) hypertension: Secondary | ICD-10-CM

## 2020-05-12 LAB — BASIC METABOLIC PANEL
BUN/Creatinine Ratio: 15 (ref 12–28)
BUN: 14 mg/dL (ref 8–27)
CO2: 25 mmol/L (ref 20–29)
Calcium: 9.6 mg/dL (ref 8.7–10.3)
Chloride: 104 mmol/L (ref 96–106)
Creatinine, Ser: 0.95 mg/dL (ref 0.57–1.00)
GFR calc Af Amer: 69 mL/min/{1.73_m2} (ref >59)
GFR calc non Af Amer: 60 mL/min/{1.73_m2} (ref >59)
Glucose: 66 mg/dL (ref 65–99)
Potassium: 4.2 mmol/L (ref 3.5–5.2)
Sodium: 141 mmol/L (ref 134–144)

## 2020-05-12 MED ORDER — VALSARTAN 320 MG PO TABS: ORAL_TABLET | ORAL | 3 refills | Status: DC

## 2020-05-12 NOTE — Patient Instructions (Addendum)
It was nice to see you today!  Your blood pressure goal is < 130/27mmHg. Continue to monitor your blood pressure readings at home  Continue taking valsartan 320mg  daily  Call clinic with any consistently elevated readings [phone]

## 2020-05-12 NOTE — Progress Notes (Signed)
Patient ID: Kristy Jacobs                 DOB: 02-28-1946                      MRN: [id number]     HPI: Kristy Jacobs is a 74 y.o. female referred by Dr. Harrington Challenger to HTN clinic. PMH is significant for HTN, SOB with activity, and syncopal spells, often after standing quickly with last spell in 2015. She was see in clinic 03/07/20 and BP was elevated at 154/100. Home BP readings had ranged 120-170s/60-80s. She was experiencing dyspnea with elevated BP as potential contributing factor, denied syncope at the time. She was started on HCTZ 12.5mg  daily and set up for cardiopulmonary stress test.   4 days later, she went to the ED with syncopal event. She has had episodes like this in the past. Seemed less likely a cardiac arrhythmia given EKG unchanged from prior and based off the history and physical. CT of the head was normal, pt felt better after IV fluids. Suspected orthostatic/vasovagal event with some dehydration. HCTZ was discontinued since BP was in the normal range. Recommended discussion for event/holter monitor. She called cardiology office as well and was advised to cut losartan in half and take 25mg  BID and to continue monitoring BP at home. Zio patch 03/26/20 showed no arrhythmias, pt was encouraged to stay hydrated and monitor BP. She also underwent cardiopulmonary exercise testing 04/08/20 which revealed a low normal functional capacity with resting spirometry suggesting severe obstructive/restrictive lung disease - pt has been referred to pulm. Severe hypertensive response to exercise was also noted (baseline BP 162/88, peak BP 230/82). Of note, pt's BP does increase in clinic, consistent with white coat HTN diagnosis.   Pt presents today in good spirits. She took 1/2 tab of valsartan for about 5 days before increasing to the full 320mg  dose. She has been tolerating this well and denies dizziness or balance problems.   She has been avoiding NSAIDs and Sudafed. Occasionally uses Mucinex for chronic  congestion. Allergist has noted that she is only allergic to [facility] trees. She was given a nasal spray which has helped with her congestion. No other episodes of dizziness or syncope at home. Still feels SOB when walking around her house, also worse when walking uphill or in high humidity. Her husband takes amlodipine for his BP which has been working well for him.  BP readings - Home cuff: 165/81 Clinic cuff: 158/82  Current HTN meds: valsartan 320mg  daily Previously tried: HCTZ 12.5mg  daily - syncopal episode, hypokalemia to 3.2, ACEi - cough BP goal: <130/56mmHg - ok if a bit above this due to syncopal spells in past  Family History: The patient's family history includes Cancer in her father; Hypertension in her brother, father, mother, and sister; Stroke in her father.  Social History: The patient  reports that she has never smoked. She has never used smokeless tobacco. She reports current alcohol use of about 2.0 standard drinks of alcohol per week. She reports that she does not use drugs.   Diet: 1 cup of coffee each day. Likes fruit and salads. Tries to watch her salt intake. Limits red meat, eats more chicken or fish.  Exercise: Walks, does yoga, has a stationary bike.  Home BP readings: Excellent improvement, all home readings at goal now. Ranging 113/67 - 130/79 over the past 2-3 weeks , HR 60-70s. Improvement from 254-982 systolic in late October - early/mid November. Pt  has been subtracting 24mmHg from SBP reading due to mild discrepancy noted at last visit, confirmed at today's visit too.  Wt Readings from Last 3 Encounters:  03/11/20 155 lb (70.3 kg)  03/07/20 160 lb 6.4 oz (72.8 kg)  03/05/19 165 lb (74.8 kg)   BP Readings from Last 3 Encounters:  04/17/20 (!) 168/95  03/25/20 138/82  03/11/20 125/87   Pulse Readings from Last 3 Encounters:  04/17/20 70  03/25/20 88  03/11/20 67    Renal function: CrCl cannot be calculated (Patient's most recent lab result is older  than the maximum 21 days allowed.).  Past Medical History:  Diagnosis Date  . Allergy   . Arthritis   . Hypertension     Current Outpatient Medications on File Prior to Visit  Medication Sig Dispense Refill  . Ascorbic Acid (VITAMIN C PO) Take 500 mg by mouth daily.    . Calcium Citrate (CAL-CITRATE PO) Take 1 tablet by mouth as directed.    . IPRATROPIUM BROMIDE NA Place 1-2 sprays into the nose 3 (three) times daily as needed.    . Multiple Vitamin (MULTIVITAMIN) tablet Take 1 tablet by mouth daily.    . Probiotic Product (PROBIOTIC PO) Take 1 tablet by mouth daily.    . valsartan (DIOVAN) 320 MG tablet Take 1/2 to 1 tablet daily by mouth as tolerated 30 tablet 11   No current facility-administered medications on file prior to visit.    Allergies  Allergen Reactions  . Ace Inhibitors Cough  . Compazine [Prochlorperazine Edisylate] Nausea And Vomiting  . Hctz [Hydrochlorothiazide]     hypokalemia     Assessment/Plan:  1. Hypertension - BP remains elevated in clinic due to white coat HTN, although home readings have improved notably and are now consistently at goal <130/47mmHg. Accuracy of home BP cuff has been confirmed in clinic on 2 separate visits (her cuff measures SBP 13mmHg - she subtracts this amount as she records her readings). Will continue valsartan 320mg  daily since home readings are consistently at goal. Checking BMET today. F/u in HTN clinic as needed.   Lucas Winograd E. Deloy Archey, PharmD, BCACP, [place] [address] [place], [place] [address] Phone: [phone]; Fax: [phone] 05/12/2020 7:59 AM

## 2020-05-19 ENCOUNTER — Telehealth: Payer: Self-pay | Admitting: Pharmacist

## 2020-05-19 MED ORDER — VALSARTAN 320 MG PO TABS: 320.0000 mg | ORAL_TABLET | Freq: Every day | ORAL | 0 refills | Status: DC

## 2020-05-19 MED ORDER — AMLODIPINE BESYLATE 2.5 MG PO TABS: 2.5000 mg | ORAL_TABLET | Freq: Every day | ORAL | 11 refills | Status: DC

## 2020-05-19 NOTE — Telephone Encounter (Signed)
Pt called clinic. States she had an episode on Saturday where she became very dizzy. Later when she checked her BP, it was 158/80 but got to 190s/97 later that evening. She called her PCP and the person on call told her to take her BP med earlier (4pm instead of 9-10pm). Reports that since then, BP has been running higher 148/78, 158/83. States any change in her schedule makes her hyper/anxious. She is going to [facility] on Wed through next Mon.  Will start amlodipine 2.5mg  daily in the AM. Pt will continue valsartan 320mg  in the PM. Also sent in 1 week of valsartan to her local pharmacy since she is still waiting for her mail order delivery. She is bring her BP cuff with her to [facility]. Pt will call clinic in a few weeks to let me know how her BP is doing.

## 2020-05-20 ENCOUNTER — Encounter: Payer: Self-pay | Admitting: Pulmonary Disease

## 2020-05-20 ENCOUNTER — Other Ambulatory Visit: Payer: Self-pay

## 2020-05-20 ENCOUNTER — Ambulatory Visit: Payer: Medicare Other | Admitting: Pulmonary Disease

## 2020-05-20 VITALS — BP 116/72 | HR 79 | Temp 97.2°F | Ht 65.0 in | Wt 162.2 lb

## 2020-05-20 DIAGNOSIS — R0602 Shortness of breath: Secondary | ICD-10-CM | POA: Diagnosis not present

## 2020-05-20 NOTE — Patient Instructions (Signed)
Shortness of breath  Possible obstruction in your airway from your recent cardiopulmonary exercise study  We will obtain a full breathing study, as we discussed there may also be possible restriction-this may need followed up with a CT chest  Graded exercise as tolerated  Continue to use albuterol as needed  I will see you in about 4 to 6 weeks, Will try and see you on the day the study is performed  Call with significant concerns

## 2020-05-20 NOTE — Progress Notes (Signed)
Kristy Jacobs    [id number]    01/24/1946  Primary Care Physician:Griffin, Neville Route, DO  Referring Physician: Fay Records, MD [address] [place] [place],  [place] [address]  Chief complaint: Being seen for shortness of breath  HPI:  Has been having ongoing symptoms for about 5 years Remembers having a PFT done about 5 years ago when she was given an inhaler at the time The inhaler did not seem to help  With worsening shortness of breath recently she ended up with cardiology and got set up for a cardiopulmonary exercise test Pretest PFT did reveal possible restrictive disease with severely reduced FEV1 and FVC  Abnormal hypertensive response to exercise  She has a bad right knee that limits activity  She does have iron deficiency  Never smoker Some exposure to secondhand smoke  No occupational predisposition to lung disease  Outpatient Encounter Medications as of 05/20/2020  Medication Sig  . amLODipine (NORVASC) 2.5 MG tablet Take 1 tablet (2.5 mg total) by mouth daily.  . Ascorbic Acid (VITAMIN C PO) Take 500 mg by mouth daily.  . Calcium Citrate (CAL-CITRATE PO) Take 1 tablet by mouth as directed.  . IPRATROPIUM BROMIDE NA Place 1-2 sprays into the nose 3 (three) times daily as needed.  . Multiple Vitamin (MULTIVITAMIN) tablet Take 1 tablet by mouth daily.  . Probiotic Product (PROBIOTIC PO) Take 1 tablet by mouth daily.  . valsartan (DIOVAN) 320 MG tablet Take 1 tablet by mouth daily   No facility-administered encounter medications on file as of 05/20/2020.    Allergies as of 05/20/2020 - Review Complete 05/20/2020  Allergen Reaction Noted  . Ace inhibitors Cough 02/28/2020  . Compazine [prochlorperazine edisylate] Nausea And Vomiting 01/28/2014  . Hctz [hydrochlorothiazide]  04/17/2020    Past Medical History:  Diagnosis Date  . Allergy   . Arthritis   . Hypertension     Past Surgical History:  Procedure Laterality Date  . NO PAST  SURGERIES      Family History  Problem Relation Age of Onset  . Hypertension Mother   . Hypertension Father   . Cancer Father   . Stroke Father   . Hypertension Brother   . Hypertension Sister     Social History   Socioeconomic History  . Marital status: Married    Spouse name: Not on file  . Number of children: Not on file  . Years of education: Not on file  . Highest education level: Not on file  Occupational History  . Not on file  Tobacco Use  . Smoking status: Never Smoker  . Smokeless tobacco: Never Used  Substance and Sexual Activity  . Alcohol use: Yes    Alcohol/week: 2.0 standard drinks    Types: 2 Glasses of wine per week  . Drug use: No  . Sexual activity: Yes  Other Topics Concern  . Not on file  Social History Narrative  . Not on file   Social Determinants of Health   Financial Resource Strain:   . Difficulty of Paying Living Expenses: Not on file  Food Insecurity:   . Worried About Charity fundraiser in the Last Year: Not on file  . Ran Out of Food in the Last Year: Not on file  Transportation Needs:   . Lack of Transportation (Medical): Not on file  . Lack of Transportation (Non-Medical): Not on file  Physical Activity:   . Days of Exercise  per Week: Not on file  . Minutes of Exercise per Session: Not on file  Stress:   . Feeling of Stress : Not on file  Social Connections:   . Frequency of Communication with Friends and Family: Not on file  . Frequency of Social Gatherings with Friends and Family: Not on file  . Attends Religious Services: Not on file  . Active Member of Clubs or Organizations: Not on file  . Attends Archivist Meetings: Not on file  . Marital Status: Not on file  Intimate Partner Violence:   . Fear of Current or Ex-Partner: Not on file  . Emotionally Abused: Not on file  . Physically Abused: Not on file  . Sexually Abused: Not on file    Review of Systems  Respiratory: Positive for shortness of breath.  Negative for cough.   Cardiovascular: Negative for chest pain.  All other systems reviewed and are negative.   Vitals:   05/20/20 1543  BP: 116/72  Pulse: 79  Temp: (!) 97.2 F (36.2 C)  SpO2: 98%     Physical Exam Constitutional:      Appearance: Normal appearance.  HENT:     Nose: No congestion.     Mouth/Throat:     Mouth: Mucous membranes are moist.     Pharynx: No posterior oropharyngeal erythema.  Eyes:     General:        Right eye: No discharge.        Left eye: No discharge.  Cardiovascular:     Rate and Rhythm: Normal rate and regular rhythm.     Heart sounds: No murmur heard.  No friction rub.  Pulmonary:     Effort: No respiratory distress.     Breath sounds: No stridor. No wheezing.  Musculoskeletal:     Cervical back: No rigidity or tenderness.  Neurological:     Mental Status: She is alert.  Psychiatric:        Mood and Affect: Mood normal.    Data Reviewed: Chest x-ray 03/11/2020-no infiltrative process noted on chest x-ray reviewed with the patient  Exercise stress study reviewed  Assessment:  Shortness of breath with activity  Abnormal exercise stress test with abnormal pulmonary parameters-possible combined obstruction and restriction  Inhalers have not helped in the past, still has albuterol at home that she can use  Inhaler technique was reviewed with the patient, taught and reviewed  Plan/Recommendations: We will obtain a full PFT  Encouraged to use albuterol as needed  Graded exercises as tolerated  Follow-up in 4 to 6 weeks   Sherrilyn Rist MD [facility] 05/20/2020, 4:33 PM  CC: Fay Records, MD

## 2020-06-02 ENCOUNTER — Other Ambulatory Visit: Payer: Self-pay | Admitting: Pharmacist

## 2020-06-02 MED ORDER — AMLODIPINE BESYLATE 2.5 MG PO TABS: 2.5000 mg | ORAL_TABLET | Freq: Every day | ORAL | 3 refills | Status: DC

## 2020-06-24 ENCOUNTER — Telehealth: Payer: Self-pay | Admitting: Pharmacist

## 2020-06-24 NOTE — Telephone Encounter (Signed)
Called pt to follow up with MyChart message from 12/20. She stated mail order has been taking too long to send her meds. Advised her that I can send rx into her local pharmacy if she prefers. She will use up her current med supply and let us know if the next refill is still an issue and rx can be transferred to local pharmacy then.

## 2020-08-21 ENCOUNTER — Ambulatory Visit: Payer: Medicare Other | Admitting: Sports Medicine

## 2020-08-22 ENCOUNTER — Other Ambulatory Visit (HOSPITAL_COMMUNITY)
Admission: RE | Admit: 2020-08-22 | Discharge: 2020-08-22 | Disposition: A | Discharge status: Home / Self Care | Payer: Medicare Other | Source: Ambulatory Visit | Attending: Pulmonary Disease | Admitting: Pulmonary Disease

## 2020-08-22 DIAGNOSIS — Z20822 Contact with and (suspected) exposure to covid-19: Secondary | ICD-10-CM | POA: Insufficient documentation

## 2020-08-22 DIAGNOSIS — Z01812 Encounter for preprocedural laboratory examination: Secondary | ICD-10-CM | POA: Diagnosis present

## 2020-08-22 LAB — SARS CORONAVIRUS 2 (TAT 6-24 HRS): SARS Coronavirus 2: NEGATIVE

## 2020-08-25 ENCOUNTER — Telehealth: Payer: Self-pay | Admitting: Pulmonary Disease

## 2020-08-25 ENCOUNTER — Ambulatory Visit: Payer: Medicare Other | Admitting: Pulmonary Disease

## 2020-08-25 NOTE — Telephone Encounter (Signed)
Called and spoke with pt and she stated that last Tuesday is when all of this started with the nasal congestion and cough.  She stated that her throat is sore from the PND or more irritated and not sore.   She stated that all of the congestion has been clear and has not gone into her chest.  She has been taking tylenol, mucinex, ipratroprium bromide nasal spray and saline nasal mist.  She stated that the weekend her congestion was worse, but she seems a little better today.  AO any other suggestions?  Thanks  Allergies  Allergen Reactions  . Ace Inhibitors Cough  . Compazine [Prochlorperazine Edisylate] Nausea And Vomiting  . Hctz [Hydrochlorothiazide]     hypokalemia

## 2020-08-25 NOTE — Telephone Encounter (Signed)
pt is calling because she is experiencing a cough & head congestion states it started last week, but symptom worsen over weekend, pt believes that its a head cold or allergies.  pt said that her symptoms arent as bad this morning, as they were saturday & sunday. Negative COVID test  from 3/11 pt uses [facility] on [address] in [place] please advise [phone]

## 2020-08-26 NOTE — Telephone Encounter (Signed)
Called and spoke with pt letting her know the info stated by Dr. Jenetta Downer and she verbalized understanding. Nothing further needed.

## 2020-08-26 NOTE — Telephone Encounter (Signed)
Most likely viral syndrome, will resolve with time  Supportive treatment as you have been doing is appropriate  Call back if you feel its leading to an infection where you may have more congestion and a lot of discolored secretions/fever  -Antibiotic and steroid will be appropriate in that situation

## 2020-09-18 ENCOUNTER — Other Ambulatory Visit: Payer: Self-pay

## 2020-09-18 ENCOUNTER — Ambulatory Visit: Payer: Medicare Other | Admitting: Sports Medicine

## 2020-09-18 VITALS — Ht 64.0 in | Wt 158.0 lb

## 2020-09-18 DIAGNOSIS — M2022 Hallux rigidus, left foot: Secondary | ICD-10-CM

## 2020-09-18 DIAGNOSIS — M2021 Hallux rigidus, right foot: Secondary | ICD-10-CM

## 2020-09-18 DIAGNOSIS — Q667 Congenital pes cavus, unspecified foot: Secondary | ICD-10-CM | POA: Diagnosis not present

## 2020-09-18 DIAGNOSIS — M1711 Unilateral primary osteoarthritis, right knee: Secondary | ICD-10-CM | POA: Insufficient documentation

## 2020-09-18 DIAGNOSIS — M21619 Bunion of unspecified foot: Secondary | ICD-10-CM | POA: Diagnosis not present

## 2020-09-18 NOTE — Assessment & Plan Note (Signed)
This is secondary to bunion formation  We will start with sports insoles  RT with medial wedge and lift to balance flexion contracture of RT knee  Left with schaphoid pad  Trial of first ray post bilat  After month consider custom orthotics if these help

## 2020-09-18 NOTE — Assessment & Plan Note (Signed)
Try a compression sleeve This lessens her dynamic genu valgus

## 2020-09-18 NOTE — Progress Notes (Signed)
Office Visit Note   Patient: Kristy Jacobs           Date of Birth: 08-23-1945           MRN: [id number] Visit Date: 09/18/2020 Requested by: Kristy Pheasant, DO [address] [place],  [place] [address] PCP: No primary care provider on file.  Subjective: CC: Bilateral foot pain, right knee pain  HPI: 75 year old female presenting to clinic today with concerns of bilateral foot pain.  Patient states her feet have both troubled her for many years, but the right has been getting progressively worse over the past few months.  She has a friend who has been seen in clinic here for orthotics, who highly recommended the treatment.  She states that her feet feel very "stiff" in the mornings, causing her a significant amount of pain until they start to loosen up after several steps around the home.  She says her right big toe has gotten progressively stiffer, and with that it has become much more painful over the past few months.  She also endorses pain throughout the balls of her feet, and throughout the midfoot.  She says her right big toe will occasionally feel numb, though she denies a history of diabetes or other neuropathies.  She will also get significant calluses under both of her first MTP joints, which she usually uses a pumice stone to reduce. Additionally, patient endorses a worsening pain of her right knee, which she worries may be due to the foot pain.  She says that she used to be very active every day, walking 3 to 4 miles with her husband, but her foot pain and her knee pain have now stopped her from doing this.  She would like to return to this activity if possible.  Several years ago, patient says she spoke with an orthopedic surgeon about her right knee, and was told that she would need a knee replacement at that time.  She says that she felt this was too aggressive, and decided not to seek further care.              ROS:   All other systems were reviewed and are  negative.  Objective: Vital Signs: BP (!) 146/80   Ht 5\' 4"  (1.626 m)   Wt 158 lb (71.7 kg)   BMI 27.12 kg/m  No flowsheet data found.   No flowsheet data found.  Physical Exam:  General:  Alert and oriented, in no acute distress. Pulm:  Breathing unlabored. Psy:  Normal mood, congruent affect. Skin: Bilateral feet without bruises or rashes.  Does have mild erythema over bilateral MTP joints.  Excessive callus formation medial and and beneath bilateral first MTP joints.  Bilateral foot exam: General assessment: During ambulation, patient is noticed to have a dynamic valgus shift with her right knee.  She walks stiffly with the right leg, with decreased stance phase. Inspection: Flexible pes cavus feet bilaterally, with significant arch collapse on the right with standing.  Both feet demonstrate hallux valgus deformity, and bony bossing over the first MTP joints.  Morton's toe bilaterally, with mild fourth and fifth clawing. Second toe on right foot with early evidence of hammer deformity.  Seated Exam: Full range of motion in ankles bilaterally.  Hallux rigidus bilaterally, right much more affected than left.  All other toes with full range of motion.  Palpation: Tenderness to palpation over the right first MTP joint.  No significant tenderness throughout midfoot.  No  pain with calf or Achilles squeeze.  No Tenderness over the peroneal tendons.   Anterior drawer without anterior slide Talar Tilt appropriate No Tenderness over the Anterior joint line  Right knee exam: Moderate patellar crepitus. Reduced extension of right knee by approximately 5 degrees.  Flexion remains intact.  Left knee with full range of motion.  Palpation: Endorses tenderness to palpation over the medial joint line, as well as with patellar compression.  No tenderness over the lateral joint line.  No tenderness over the patellar tendon.  Supine exam: Trace effusion, normal patellar mobility.   Ligamentous  Exam:  No pain or laxity with anterior/posterior drawer.  No obvious Sag.  No pain though does demonstrate pseudolaxity with valgus stress across the knee.  Meniscus:  McMurray with no pain or deep clicking.   Strength: Hip flexion (L1), Hip Aduction (L2), Knee Extension (L3) are 5/5 Bilaterally Foot Inversion (L4), Dorsiflexion (L5), and Eversion (S1) 5/5 Bilaterally  Sensation: Intact to light touch medial and lateral aspects of lower extremities, and lateral, dorsal, and medial aspects of foot.   Imaging: No results found.  Assessment & Plan: 75 year old female presenting to clinic with bilateral foot pain, as well as right knee pain.  Examination as above, overall concerning for right knee arthritis and bunion deformities of bilateral feet, accompanied by arch collapse. -Due to reduced extension and right knee, patient was fit with a heel lift on the right to help accommodate for this. -Arch collapse bilaterally, and provided with scaphoid pad for further arch support on the left (aforementioned heel lift long enough to support arch on right.) -Even the dynamic valgus shift with walking in her right knee, was provided with body helix compression sleeve for further support. -Provided with basic quadricep exercises for knee stabilization. -Patient counseled to wear provided Hapad orthotics for 1 month.  If she notices improvement in her symptoms patient would likely benefit from custom orthotics.  If she fails to notice any improvement, she is to return to clinic for reevaluation. -Patient was agreeable with plan.  She had no further questions or concerns today.     Procedures: No procedures performed     I observed and examined the patient with the [facility] resident and agree with assessment and plan.  Note reviewed and modified by me. Ila Mcgill, MD

## 2020-10-03 ENCOUNTER — Other Ambulatory Visit (HOSPITAL_COMMUNITY)
Admission: RE | Admit: 2020-10-03 | Discharge: 2020-10-03 | Disposition: A | Discharge status: Home / Self Care | Payer: Medicare Other | Source: Ambulatory Visit | Attending: Pulmonary Disease | Admitting: Pulmonary Disease

## 2020-10-03 DIAGNOSIS — Z01812 Encounter for preprocedural laboratory examination: Secondary | ICD-10-CM | POA: Diagnosis present

## 2020-10-03 DIAGNOSIS — Z20822 Contact with and (suspected) exposure to covid-19: Secondary | ICD-10-CM | POA: Diagnosis not present

## 2020-10-03 LAB — SARS CORONAVIRUS 2 (TAT 6-24 HRS): SARS Coronavirus 2: NEGATIVE

## 2020-10-06 ENCOUNTER — Other Ambulatory Visit: Payer: Self-pay

## 2020-10-06 ENCOUNTER — Ambulatory Visit: Payer: Medicare Other | Admitting: Pulmonary Disease

## 2020-10-06 ENCOUNTER — Encounter: Payer: Self-pay | Admitting: Pulmonary Disease

## 2020-10-06 ENCOUNTER — Ambulatory Visit (INDEPENDENT_AMBULATORY_CARE_PROVIDER_SITE_OTHER): Payer: Medicare Other | Admitting: Pulmonary Disease

## 2020-10-06 VITALS — BP 144/86 | HR 78 | Temp 97.4°F | Ht 64.0 in | Wt 159.0 lb

## 2020-10-06 DIAGNOSIS — R0602 Shortness of breath: Secondary | ICD-10-CM | POA: Diagnosis not present

## 2020-10-06 DIAGNOSIS — J42 Unspecified chronic bronchitis: Secondary | ICD-10-CM | POA: Diagnosis not present

## 2020-10-06 LAB — PULMONARY FUNCTION TEST
DL/VA % pred: 122 %
DL/VA: 5.03 ml/min/mmHg/L
DLCO cor % pred: 101 %
DLCO cor: 19.45 ml/min/mmHg
DLCO unc % pred: 101 %
DLCO unc: 19.45 ml/min/mmHg
FEF 25-75 Post: 0.58 L/sec
FEF 25-75 Pre: 0.44 L/sec
FEF2575-%Change-Post: 31 %
FEF2575-%Pred-Post: 32 %
FEF2575-%Pred-Pre: 25 %
FEV1-%Change-Post: 11 %
FEV1-%Pred-Post: 50 %
FEV1-%Pred-Pre: 45 %
FEV1-Post: 1.09 L
FEV1-Pre: 0.98 L
FEV1FVC-%Change-Post: 4 %
FEV1FVC-%Pred-Pre: 73 %
FEV6-%Change-Post: 6 %
FEV6-%Pred-Post: 67 %
FEV6-%Pred-Pre: 63 %
FEV6-Post: 1.85 L
FEV6-Pre: 1.74 L
FEV6FVC-%Change-Post: 0 %
FEV6FVC-%Pred-Post: 104 %
FEV6FVC-%Pred-Pre: 103 %
FVC-%Change-Post: 6 %
FVC-%Pred-Post: 65 %
FVC-%Pred-Pre: 61 %
FVC-Post: 1.88 L
FVC-Pre: 1.77 L
Post FEV1/FVC ratio: 58 %
Post FEV6/FVC ratio: 99 %
Pre FEV1/FVC ratio: 56 %
Pre FEV6/FVC Ratio: 99 %
RV % pred: 154 %
RV: 3.48 L
TLC % pred: 104 %
TLC: 5.3 L

## 2020-10-06 MED ORDER — MONTELUKAST SODIUM 10 MG PO TABS: 10.0000 mg | ORAL_TABLET | Freq: Every day | ORAL | 5 refills | Status: DC

## 2020-10-06 MED ORDER — FLUTICASONE FUROATE-VILANTEROL 100-25 MCG/INH IN AEPB: 1.0000 | INHALATION_SPRAY | Freq: Every day | RESPIRATORY_TRACT | 3 refills | Status: DC

## 2020-10-06 NOTE — Patient Instructions (Signed)
Prescription for Breo to be used daily Continue albuterol as needed  Prescription for Singulair sent in  I will see you back in 3 months  Continue with graded exercise as tolerated  Call with significant concerns

## 2020-10-06 NOTE — Progress Notes (Signed)
PFT done today. 

## 2020-10-06 NOTE — Progress Notes (Signed)
Kristy Jacobs    [id number]    [birth year]  Primary Care Physician:Clark, Nettie Elm, MD  Referring Physician: Einar Pheasant, DO [address] [place],  [place] [address]  Chief complaint: Being seen for shortness of breath  HPI:  Has been having ongoing symptoms for about 5 years Remembers having a PFT done about 5 years ago when she was given an inhaler at the time The inhaler did not seem to help  Has been doing about the same Limited more by arthritic knee problems on the right side  She has worsening shortness of breath Restrictive physiology on PFT which she had prior to cardiopulmonary exercise  For PFT was performed prior to this visit showing severe obstructive disease with no restriction  Abnormal hypertensive response to exercise  She has a bad right knee that limits activity  She does have iron deficiency  Never smoker Some exposure to secondhand smoke  No occupational predisposition to lung disease  Outpatient Encounter Medications as of 10/06/2020  Medication Sig  . amLODipine (NORVASC) 2.5 MG tablet Take 1 tablet (2.5 mg total) by mouth daily.  . Ascorbic Acid (VITAMIN C PO) Take 500 mg by mouth daily.  . Calcium Citrate (CAL-CITRATE PO) Take 1 tablet by mouth as directed.  . IPRATROPIUM BROMIDE NA Place 1-2 sprays into the nose 3 (three) times daily as needed.  . Multiple Vitamin (MULTIVITAMIN) tablet Take 1 tablet by mouth daily.  . Probiotic Product (PROBIOTIC PO) Take 1 tablet by mouth daily.  . valsartan (DIOVAN) 320 MG tablet Take 1 tablet by mouth daily   No facility-administered encounter medications on file as of 10/06/2020.    Allergies as of 10/06/2020 - Review Complete 10/06/2020  Allergen Reaction Noted  . Ace inhibitors Cough 02/28/2020  . Compazine [prochlorperazine edisylate] Nausea And Vomiting 01/28/2014  . Hctz [hydrochlorothiazide]  04/17/2020    Past Medical History:  Diagnosis Date  . Allergy    . Arthritis   . Hypertension     Past Surgical History:  Procedure Laterality Date  . NO PAST SURGERIES      Family History  Problem Relation Age of Onset  . Hypertension Mother   . Hypertension Father   . Cancer Father   . Stroke Father   . Hypertension Brother   . Hypertension Sister     Social History   Socioeconomic History  . Marital status: Married    Spouse name: Not on file  . Number of children: Not on file  . Years of education: Not on file  . Highest education level: Not on file  Occupational History  . Not on file  Tobacco Use  . Smoking status: Never Smoker  . Smokeless tobacco: Never Used  Substance and Sexual Activity  . Alcohol use: Yes    Alcohol/week: 2.0 standard drinks    Types: 2 Glasses of wine per week  . Drug use: No  . Sexual activity: Yes  Other Topics Concern  . Not on file  Social History Narrative  . Not on file   Social Determinants of Health   Financial Resource Strain: Not on file  Food Insecurity: Not on file  Transportation Needs: Not on file  Physical Activity: Not on file  Stress: Not on file  Social Connections: Not on file  Intimate Partner Violence: Not on file    Review of Systems  Respiratory: Positive for shortness of breath. Negative for cough.  Cardiovascular: Negative for chest pain.  All other systems reviewed and are negative.   Vitals:   10/06/20 1052  BP: (!) 144/86  Pulse: 78  Temp: (!) 97.4 F (36.3 C)  SpO2: 98%     Physical Exam Constitutional:      Appearance: Normal appearance.  HENT:     Nose: No congestion.     Mouth/Throat:     Mouth: Mucous membranes are moist.     Pharynx: No posterior oropharyngeal erythema.  Eyes:     General:        Right eye: No discharge.        Left eye: No discharge.  Cardiovascular:     Rate and Rhythm: Normal rate and regular rhythm.     Heart sounds: No murmur heard. No friction rub.  Pulmonary:     Effort: No respiratory distress.      Breath sounds: No stridor. No wheezing.  Musculoskeletal:     Cervical back: No rigidity or tenderness.  Neurological:     Mental Status: She is alert.  Psychiatric:        Mood and Affect: Mood normal.    Data Reviewed: Chest x-ray 03/11/2020-no infiltrative process noted on chest x-ray reviewed with the patient  Exercise stress study reviewed  PFT with severe obstructive disease with no significant bronchodilator response  Assessment:  Shortness of breath with activity  Chronic obstructive pulmonary disease -Evidence on PFT  Abnormal exercise stress test with abnormal pulmonary parameters-possible combined obstruction and restriction  Prescription for Breo to be used daily  Prescription for Singulair sent into pharmacy for allergies   Plan/Recommendations: We will obtain a full PFT  Encouraged to use albuterol as needed  Graded exercises as tolerated  Follow-up in 4 to 6 weeks  I spent 30 minutes dedicated to the care of this patient on the date of this encounter to include previsit review of records, face-to-face time with the patient discussing conditions above, post visit ordering of testing, clinical documentation with electronic health record and communicated necessary findings to members of the patient's care team Sherrilyn Rist MD [facility] 10/06/2020, 10:57 AM  CC: Einar Pheasant, DO

## 2020-10-16 ENCOUNTER — Other Ambulatory Visit: Payer: Self-pay

## 2020-10-16 ENCOUNTER — Ambulatory Visit: Payer: Medicare Other | Admitting: Family Medicine

## 2020-10-16 VITALS — BP 138/88 | Ht 64.0 in | Wt 156.0 lb

## 2020-10-16 DIAGNOSIS — M21619 Bunion of unspecified foot: Secondary | ICD-10-CM | POA: Diagnosis not present

## 2020-10-16 DIAGNOSIS — Q667 Congenital pes cavus, unspecified foot: Secondary | ICD-10-CM | POA: Diagnosis not present

## 2020-10-16 DIAGNOSIS — M2021 Hallux rigidus, right foot: Secondary | ICD-10-CM | POA: Diagnosis not present

## 2020-10-16 DIAGNOSIS — M2022 Hallux rigidus, left foot: Secondary | ICD-10-CM

## 2020-10-16 NOTE — Progress Notes (Signed)
Office Visit Note   Patient: Kristy Jacobs           Date of Birth: 1945-10-19           MRN: [id number] Visit Date: 10/16/2020 Requested by: Shanon Ace, MD [address] [place],  [place] [address] PCP: Shanon Ace, MD  Subjective: CC: Follow-up foot pain  HPI: 75 year old female presenting to clinic today to follow-up on bilateral foot pain, previously treated with Hapad inserts.  Patient states that her Hapads have been causing an uncomfortable amount of "out turning" of both of her feet, and she is wondering if she can get the arch support reduced.  She was also given a heel lift on the right leg due to reduced extension secondary to knee arthritis, but she states this is uncomfortable.  Additionally, she continues to endorse pain in the first MTP joint on the right.  She still tries to walk 1 to 2 miles daily for exercise, and is extremely happy with how the knee sleeve has helped to reduce her knee pain and swelling during this activity.  She is optimistic that orthotics adjustments will allow her to be more comfortable with her daily walks.  She has no additional concerns today.              ROS:   All other systems were reviewed and are negative.  Objective: Vital Signs: BP 138/88   Ht 5\' 4"  (1.626 m)   Wt 156 lb (70.8 kg)   BMI 26.78 kg/m  No flowsheet data found.   No flowsheet data found.  Physical Exam:  General:  Alert and oriented, in no acute distress. Pulm:  Breathing unlabored. Psy:  Normal mood, congruent affect. Skin: Bilateral feet without bruises, rashes, or erythema. Overlying skin intact.  Bilateral foot exam:  Inspection: Flexible pes cavus feet bilaterally, with significant arch collapse on the right with standing.  Both feet demonstrate hallux valgus deformity, and bony bossing over the first MTP joints.  Morton's toe bilaterally, with mild fourth and fifth clawing. Second toe on right foot with early evidence of hammer  deformity.  Seated Exam: Full range of motion in ankles bilaterally.  Hallux rigidus bilaterally, right much more affected than left.  All other toes with full range of motion.  Palpation: Tenderness to palpation over the right first MTP joint.  No significant tenderness throughout midfoot.    All toes with sensation intact and brisk capillary refill.  Imaging: None today.  Assessment & Plan: 75 year old female presenting to clinic to follow-up on bilateral foot pain, hallux rigidus, and right knee arthritis.  Previously made orthotics were somewhat uncomfortable due to the high arch support, so scaphoid pads were removed bilaterally and replaced with smaller gray supports.  She states that these were far more comfortable than previous we will pads. Additionally, due to her hallux rigidus and pain in the first MTP on the right, she was fit for a steel shank in her right shoe to help reduce this movement and improve her comfort. -Patient displayed a neutral gait after orthotic fitting. -Encouraged to wear the adjusted hapad insoles with her activity to see if they offer adequate comfort.  Otherwise, return to clinic for adjustment as needed in approximately 3 to 4 weeks. -If she is very comfortable in her current orthotics, we can consider custom orthotics in the future with similar padding techniques.  Current Hapads:  Bilateral gray scaphoid supports. Bilateral first ray post. Steel shank  beneath he pad on right.    I was the preceptor for this visit and available for immediate consultation Shellia Cleverly, DO

## 2020-12-03 MED ORDER — MONTELUKAST SODIUM 10 MG PO TABS
10.0000 mg | ORAL_TABLET | Freq: Every day | ORAL | 1 refills | Status: DC
Start: 2020-12-03 — End: 2021-04-14

## 2021-01-28 ENCOUNTER — Other Ambulatory Visit: Payer: Self-pay | Admitting: Pulmonary Disease

## 2021-02-25 ENCOUNTER — Ambulatory Visit: Payer: Medicare Other | Admitting: Pulmonary Disease

## 2021-02-25 ENCOUNTER — Encounter: Payer: Self-pay | Admitting: Pulmonary Disease

## 2021-02-25 ENCOUNTER — Other Ambulatory Visit: Payer: Self-pay

## 2021-02-25 VITALS — BP 130/70 | HR 89 | Temp 97.9°F | Ht 64.0 in | Wt 159.2 lb

## 2021-02-25 DIAGNOSIS — J449 Chronic obstructive pulmonary disease, unspecified: Secondary | ICD-10-CM

## 2021-02-25 DIAGNOSIS — J42 Unspecified chronic bronchitis: Secondary | ICD-10-CM

## 2021-02-25 NOTE — Patient Instructions (Signed)
COPD -Stable status  You can discontinue Breo and just monitor symptoms  Alpha-1 antitrypsin level and phenotype-blood work was sent  I will see you back in 6 months  Call with significant concerns

## 2021-02-25 NOTE — Progress Notes (Signed)
Kristy Jacobs    QE:[id number]    March 21, 1946  Primary Care Physician:Clark, Nettie Elm, MD  Referring Physician: Shanon Ace, MD [address]  [address] [place],  [place] [address]  Chief complaint: Being seen for shortness of breath Breathing remains about the same  HPI:  Has been having ongoing symptoms for about 5 years Remembers having a PFT done about 5 years ago when she was given an inhaler at the time The inhaler did not seem to help Has been using Breo and does not feel Memory Dance is really helping much  She does use Flonase for nasal stuffiness and congestion We did try Atrovent nasal for nasal stuffiness and congestion and rhinorrhea-stopped using it because it dries her up too much, even with just 1 spray daily  Has been doing about the same Limited more by arthritic knee problems on the right side  Shortness of breath remains about the same Exercise tolerance remains about the same  PFT with severe obstructive disease  Abnormal hypertensive response to exercise  She has a bad right knee that limits activity  She does have iron deficiency  Never smoker Some exposure to secondhand smoke  No occupational predisposition to lung disease  Outpatient Encounter Medications as of 02/25/2021  Medication Sig   amLODipine (NORVASC) 2.5 MG tablet Take 1 tablet (2.5 mg total) by mouth daily.   Ascorbic Acid (VITAMIN C PO) Take 500 mg by mouth daily.   BREO ELLIPTA 100-25 MCG/INH AEPB TAKE 1 PUFF BY MOUTH EVERY DAY   Calcium Citrate (CAL-CITRATE PO) Take 1 tablet by mouth as directed.   fluticasone (FLONASE) 50 MCG/ACT nasal spray Place into both nostrils daily.   IPRATROPIUM BROMIDE NA Place 1-2 sprays into the nose 3 (three) times daily as needed.   montelukast (SINGULAIR) 10 MG tablet Take 1 tablet (10 mg total) by mouth at bedtime.   Multiple Vitamin (MULTIVITAMIN) tablet Take 1 tablet by mouth daily.   Probiotic Product (PROBIOTIC PO) Take 1  tablet by mouth daily.   valsartan (DIOVAN) 320 MG tablet Take 1 tablet by mouth daily   No facility-administered encounter medications on file as of 02/25/2021.    Allergies as of 02/25/2021 - Review Complete 02/25/2021  Allergen Reaction Noted   Ace inhibitors Cough 02/28/2020   Compazine [prochlorperazine edisylate] Nausea And Vomiting 01/28/2014   Hctz [hydrochlorothiazide]  04/17/2020    Past Medical History:  Diagnosis Date   Allergy    Arthritis    Hypertension     Past Surgical History:  Procedure Laterality Date   NO PAST SURGERIES      Family History  Problem Relation Age of Onset   Hypertension Mother    Hypertension Father    Cancer Father    Stroke Father    Hypertension Brother    Hypertension Sister     Social History   Socioeconomic History   Marital status: Married    Spouse name: Not on file   Number of children: Not on file   Years of education: Not on file   Highest education level: Not on file  Occupational History   Not on file  Tobacco Use   Smoking status: Never   Smokeless tobacco: Never  Substance and Sexual Activity   Alcohol use: Yes    Alcohol/week: 2.0 standard drinks    Types: 2 Glasses of wine per week   Drug use: No   Sexual activity: Yes  Other  Topics Concern   Not on file  Social History Narrative   Not on file   Social Determinants of Health   Financial Resource Strain: Not on file  Food Insecurity: Not on file  Transportation Needs: Not on file  Physical Activity: Not on file  Stress: Not on file  Social Connections: Not on file  Intimate Partner Violence: Not on file    Review of Systems  Respiratory:  Positive for shortness of breath. Negative for cough.   Cardiovascular:  Negative for chest pain.  All other systems reviewed and are negative.  Vitals:   02/25/21 0908  BP: 130/70  Pulse: 89  Temp: 97.9 F (36.6 C)  SpO2: 97%     Physical Exam Constitutional:      Appearance: Normal appearance.   HENT:     Nose: No congestion.     Mouth/Throat:     Mouth: Mucous membranes are moist.     Pharynx: No posterior oropharyngeal erythema.  Eyes:     General:        Right eye: No discharge.        Left eye: No discharge.  Cardiovascular:     Rate and Rhythm: Normal rate and regular rhythm.     Heart sounds: No murmur heard.   No friction rub.  Pulmonary:     Effort: No respiratory distress.     Breath sounds: No stridor. No wheezing.  Musculoskeletal:     Cervical back: No rigidity or tenderness.  Neurological:     Mental Status: She is alert.  Psychiatric:        Mood and Affect: Mood normal.   Data Reviewed: Chest x-ray 03/11/2020-no infiltrative process noted on chest x-ray reviewed with the patient  Exercise stress study reviewed  PFT with severe obstructive disease with no significant bronchodilator response  Assessment:  Shortness of breath with activity -Has remained stable  She wants to try and stay off Breo and assess symptoms -She will stay off Breo for the next 1 to 2 weeks -Resume Breo if symptoms are worse or need for rescue inhaler increases  Chronic obstructive pulmonary disease -Obstructive disease on PFT  Patient did ask questions about alpha-1 antitrypsin -We will get levels and phenotype  Abnormal exercise stress test with abnormal pulmonary parameters-possible combined obstruction and restriction  Continue Singulair and Flonase  Prescription for Singulair sent into pharmacy for allergies   Plan/Recommendations: Continue albuterol as needed  Graded exercises as tolerated  Follow-up in 6 months  Follow alpha-1 antitrypsin evaluation  Sherrilyn Rist MD [facility] 02/25/2021, 9:16 AM  CC: Shanon Ace,*

## 2021-03-05 ENCOUNTER — Telehealth: Payer: Self-pay | Admitting: Pulmonary Disease

## 2021-03-05 NOTE — Telephone Encounter (Signed)
Alpha-1 antitrypsin level of 171  Phenotype EZ  Study negative for alpha-1 antitrypsin deficiency

## 2021-03-05 NOTE — Telephone Encounter (Signed)
Called and spoke with patient regarding lab results. Patient verbalized understanding.  Nothing further needed at this time.

## 2021-03-07 LAB — ALPHA-1 ANTITRYPSIN PHENOTYPE: A-1 Antitrypsin, Ser: 171 mg/dL (ref 83–199)

## 2021-03-11 ENCOUNTER — Other Ambulatory Visit: Payer: Self-pay | Admitting: Internal Medicine

## 2021-04-10 ENCOUNTER — Other Ambulatory Visit: Payer: Self-pay | Admitting: Internal Medicine

## 2021-04-13 ENCOUNTER — Other Ambulatory Visit: Payer: Self-pay | Admitting: Pulmonary Disease

## 2021-04-13 ENCOUNTER — Other Ambulatory Visit: Payer: Self-pay | Admitting: Internal Medicine

## 2021-04-14 ENCOUNTER — Encounter: Payer: Self-pay | Admitting: Cardiology

## 2021-04-14 NOTE — Telephone Encounter (Signed)
error 

## 2021-04-16 ENCOUNTER — Other Ambulatory Visit: Payer: Self-pay | Admitting: *Deleted

## 2021-04-16 MED ORDER — FLUTICASONE FUROATE-VILANTEROL 100-25 MCG/ACT IN AEPB: 1.0000 | INHALATION_SPRAY | Freq: Every day | RESPIRATORY_TRACT | 3 refills | Status: DC

## 2021-05-21 ENCOUNTER — Other Ambulatory Visit: Payer: Self-pay | Admitting: Internal Medicine

## 2021-06-08 NOTE — Progress Notes (Signed)
Normal alpha-1 antitrypsin levels

## 2021-07-09 ENCOUNTER — Telehealth: Payer: Self-pay | Admitting: Orthopedic Surgery

## 2021-07-09 NOTE — Telephone Encounter (Signed)
Received medical records release form from patient  

## 2021-07-23 ENCOUNTER — Other Ambulatory Visit: Payer: Self-pay | Admitting: Internal Medicine

## 2021-07-29 ENCOUNTER — Ambulatory Visit (INDEPENDENT_AMBULATORY_CARE_PROVIDER_SITE_OTHER): Payer: Medicare Other

## 2021-07-29 ENCOUNTER — Ambulatory Visit: Payer: Medicare Other | Admitting: Orthopedic Surgery

## 2021-07-29 ENCOUNTER — Other Ambulatory Visit: Payer: Self-pay

## 2021-07-29 DIAGNOSIS — M79604 Pain in right leg: Secondary | ICD-10-CM

## 2021-07-30 ENCOUNTER — Encounter: Payer: Self-pay | Admitting: Orthopedic Surgery

## 2021-07-30 NOTE — Progress Notes (Signed)
Office Visit Note   Patient: Kristy Jacobs           Date of Birth: 17-Jul-1945           MRN: [id number] Visit Date: 07/29/2021 Requested by: Shanon Ace, MD [address]  [address] [place],  [place] [address] PCP: Shanon Ace, MD  Subjective: Chief Complaint  Patient presents with   Other    Complains or right lower leg instability    HPI: Kristy Jacobs is a 76 year old patient with right knee pain.  She has had right knee pain for years.  Fell on her patella about 10 years ago.  Does not have constant pain but she does have some radiation upper thigh along with buttock pain low back pain as well as occasional numbness and tingling.  Also describes left "hip" pain which is more buttock and trochanteric related.  She also describes right-sided pain going down the anterior thigh.  She also reports some right leg weakness.  She has had an injection in the knee which only helped her for 1 to 2 days.  Total knee replacement was recommended about 10 years ago in [place].  Kristy Jacobs states that it does not really hurt her most of the time but the weakness and unsteadiness is an equivalent problem to the pain.  Stairs are difficult.  She does use a sleeve.  She has a history of COPD and needs to walk.  The pain in the groin back and hip is new over the past several months.  She states that the right knee does keep swelling in it.  She does do water aerobics.  Outside notes are reviewed.  Unicompartmental versus total knee replacement recommended              ROS: All systems reviewed are negative as they relate to the chief complaint within the history of present illness.  Patient denies  fevers or chills.   Assessment & Plan: Visit Diagnoses:  1. Pain in right leg     Plan: Impression is right-sided knee pain with medial and some patellofemoral arthritis present.  She is also having some radicular type symptoms in the back and leg region.  I think she is likely heading for  knee replacement but it would be better to get a clearer picture of the amount of symptoms that the back is contributing to this clinical situation prior to undergoing knee replacement.  Follow-up with [place] after MRI scan of lumbar spine for 3 months at least of back pain and radicular pain without relief from conservative treatment measures.  Mild spondylolisthesis also present at L4-5 on plain radiographs which further necessitates back work-up prior to knee replacement with her current symptoms.  Follow-Up Instructions: Return for after MRI.   Orders:  Orders Placed This Encounter  Procedures   XR Lumbar Spine 2-3 Views   No orders of the defined types were placed in this encounter.     Procedures: No procedures performed   Clinical Data: No additional findings.  Objective: Vital Signs: There were no vitals taken for this visit.  Physical Exam:   Constitutional: Patient appears well-developed HEENT:  Head: Normocephalic Eyes:EOM are normal Neck: Normal range of motion Cardiovascular: Normal rate Pulmonary/chest: Effort normal Neurologic: Patient is alert Skin: Skin is warm Psychiatric: Patient has normal mood and affect   Ortho Exam: Ortho exam demonstrates full active and passive range of motion of the right knee.  Trace effusion is present.  She  has a little bit of calf asymmetry right versus left which is slight atrophy.  Active dorsiflexion plantarflexion quad hamstring strength 5+ out of 5 bilaterally with no paresthesias L1 S1 bilaterally.  No groin pain with internal/external rotation of either leg.  Mild trochanteric tenderness on the right compared to the left but good hip abductor strength.  Mild SI joint tenderness to palpation.  Specialty Comments:  No specialty comments available.  Imaging: XR Lumbar Spine 2-3 Views  Result Date: 07/30/2021 AP lateral lumbar spine radiographs reviewed.  Degenerative disc disease is noted at L5-S1.  There is also L4-5  spondylolisthesis grade 1-2.  Facet arthritis is present in the lower lumbar spine.  No compression fractures.  Degenerative disc disease is moderate in the disc bases above L4.    PMFS History: Patient Active Problem List   Diagnosis Date Noted   Osteoarthritis of right knee 09/18/2020   Hallux rigidus of both feet 09/18/2020   Allergy    Arthritis    Hypertension    Past Medical History:  Diagnosis Date   Allergy    Arthritis    Hypertension     Family History  Problem Relation Age of Onset   Hypertension Mother    Hypertension Father    Cancer Father    Stroke Father    Hypertension Brother    Hypertension Sister     Past Surgical History:  Procedure Laterality Date   NO PAST SURGERIES     Social History   Occupational History   Not on file  Tobacco Use   Smoking status: Never   Smokeless tobacco: Never  Substance and Sexual Activity   Alcohol use: Yes    Alcohol/week: 2.0 standard drinks    Types: 2 Glasses of wine per week   Drug use: No   Sexual activity: Yes

## 2021-08-03 ENCOUNTER — Other Ambulatory Visit: Payer: Self-pay

## 2021-08-03 DIAGNOSIS — M79604 Pain in right leg: Secondary | ICD-10-CM

## 2021-08-14 ENCOUNTER — Other Ambulatory Visit: Payer: Self-pay

## 2021-08-14 ENCOUNTER — Encounter: Payer: Self-pay | Admitting: Internal Medicine

## 2021-08-14 ENCOUNTER — Ambulatory Visit: Payer: Medicare Other | Admitting: Internal Medicine

## 2021-08-14 VITALS — BP 144/94 | HR 82 | Ht 64.5 in | Wt 160.4 lb

## 2021-08-14 DIAGNOSIS — I10 Essential (primary) hypertension: Secondary | ICD-10-CM | POA: Diagnosis not present

## 2021-08-14 DIAGNOSIS — R001 Bradycardia, unspecified: Secondary | ICD-10-CM

## 2021-08-14 DIAGNOSIS — R55 Syncope and collapse: Secondary | ICD-10-CM

## 2021-08-14 MED ORDER — VALSARTAN 320 MG PO TABS: ORAL_TABLET | ORAL | 3 refills | Status: DC

## 2021-08-14 NOTE — Patient Instructions (Addendum)

## 2021-08-14 NOTE — Progress Notes (Signed)
? ?Cardiology Office Note ? ? ?Date:  08/14/2021  ? ?ID:  Kristy Jacobs, DOB 10/27/1945, MRN [id number] ? ?PCP:  Shanon Ace, MD  ?Cardiologist:   Dorris Carnes, MD  ? ? ?F/U of bradycardia and HTN    ?  ?History of Present Illness: ?Kristy Jacobs is a 76 y.o. female with a history of ?HTN and dyspnea.   Also hx of syncope  (orthostatic, vagal)  Remote   Seen in [place] by Bartholome Bill)   Patient had hx of spells in remote pasat with quick standing   Last spell in 2015 ? ?I saw her last in clnic  in 2020  Echo in 2020 showed LVEF 50 to 01% with diastolic dysfunction.    Monitor showed SR   Average HR 84 bpm  ?In 2021 the pt had a CPX   Testing showed mixed restrictive and pulmonary patterns.   Peak exercise was severely HTNsive   From there referred to pulmonary   PFTs showed severe obstructive lung dz   She continues on inhalers    ? ?Since seen she says her breathing is up and down.    Denies CP ?Dines dizziness or syncope     ? ?BP from home 110s to 130s   /  ? ? ? ?Outpatient Medications Prior to Visit  ?Medication Sig Dispense Refill  ? amLODipine (NORVASC) 2.5 MG tablet TAKE 1 TABLET BY MOUTH  DAILY 90 tablet 3  ? Ascorbic Acid (VITAMIN C PO) Take 500 mg by mouth daily.    ? Calcium Citrate (CAL-CITRATE PO) Take 1 tablet by mouth as directed.    ? fluticasone (FLONASE) 50 MCG/ACT nasal spray Place into both nostrils daily.    ? fluticasone furoate-vilanterol (BREO ELLIPTA) 100-25 MCG/ACT AEPB Inhale 1 puff into the lungs daily. 180 each 3  ? ipratropium (ATROVENT) 0.03 % nasal spray as needed.    ? Magnesium 500 MG CAPS Take 500 mg by mouth every other day.    ? montelukast (SINGULAIR) 10 MG tablet TAKE 1 TABLET BY MOUTH AT  BEDTIME 90 tablet 3  ? Multiple Vitamin (MULTIVITAMIN) tablet Take 1 tablet by mouth daily.    ? nystatin (MYCOSTATIN/NYSTOP) powder as needed.    ? Probiotic Product (PROBIOTIC PO) Take 1 tablet by mouth daily.    ? valsartan (DIOVAN) 320 MG tablet TAKE 1 TABLET BY MOUTH DAILY.  Please keep upcoming appt in March 2023 with Dr. Harrington Challenger before anymore refills. Thank you Final Attempt 90 tablet 0  ? ?No facility-administered medications prior to visit.  ? ? ? ?Allergies:   Ace inhibitors, Compazine [prochlorperazine edisylate], and Hctz [hydrochlorothiazide]  ? ?Past Medical History:  ?Diagnosis Date  ? Allergy   ? Arthritis   ? Hypertension   ? ? ?Past Surgical History:  ?Procedure Laterality Date  ? NO PAST SURGERIES    ? ? ? ?Social History:  The patient  reports that she has never smoked. She has never used smokeless tobacco. She reports current alcohol use of about 2.0 standard drinks per week. She reports that she does not use drugs.  ? ?Family History:  The patient's family history includes Cancer in her father; Hypertension in her brother, father, mother, and sister; Stroke in her father.  ? ? ?ROS:  Please see the history of present illness. All other systems are reviewed and  Negative to the above problem except as noted.  ? ? ?PHYSICAL EXAM: ?VS:  BP (!) 144/94   Pulse 82  Ht 5' 4.5" (1.638 m)   Wt 160 lb 6.4 oz (72.8 kg)   SpO2 100%   BMI 27.11 kg/m?   ?GEN: Well nourished, well developed, in no acute distress  ?HEENT: normal  ?Neck: no JVD, no carotid bruits  ?Cardiac: RRR; no signficant murmurs; no  LE edema  ?Respiratory:  clear to auscultation bilaterally  Moving air well  ?GI: soft, nontender, nondistended, + BS  No hepatomegaly  ?MS: no deformity Moving all extremities   ?Skin: warm and dry, no rash ?Neuro:  Strength and sensation are intact ?Psych: euthymic mood, full affect ? ? ?EKG:  EKG is ordered today. SR 82 bpm  First degree AV block  PR 238 msc   RBBB  LAFB   ?Lipid Panel ?   ?Component Value Date/Time  ? CHOL 186 07/22/2015 0954  ? TRIG 81 07/22/2015 0954  ? HDL 67 07/22/2015 0954  ? CHOLHDL 2.8 07/22/2015 0954  ? VLDL 16 07/22/2015 0954  ? [place] 103 07/22/2015 0954  ? ?  ? ?Wt Readings from Last 3 Encounters:  ?08/14/21 160 lb 6.4 oz (72.8 kg)  ?02/25/21 159 lb  3.2 oz (72.2 kg)  ?10/16/20 156 lb (70.8 kg)  ?  ? ? ?ASSESSMENT AND PLAN: ? ? ?1  Dyspnea     Pt continues to have dyspnea  Now followed in plumonary    ? ?2   HTN   BP at home good   I would keep on same regimen    ? ?3  Hx dizziness/syncope   Pt denies    ? ?4  Hx bradycardia/conduction dz   Sable ? ?F/U in 9 months     ?Current medicines are reviewed at lngth with the patient today.  The patient does not have concerns regarding medicines. ? ? ? ?Signed, ?Dorris Carnes, MD  ?08/14/2021 3:34 PM    ?[facility] ?[name], [place], [place]  [address] ?Phone: [phone]; Fax: [phone]  ? ? ? ?

## 2021-08-17 ENCOUNTER — Encounter: Payer: Self-pay | Admitting: Orthopedic Surgery

## 2021-08-18 ENCOUNTER — Ambulatory Visit: Payer: Medicare Other | Admitting: Internal Medicine

## 2021-08-18 ENCOUNTER — Other Ambulatory Visit: Payer: Self-pay | Admitting: Surgical

## 2021-08-18 MED ORDER — DIAZEPAM 2 MG PO TABS: 2.0000 mg | ORAL_TABLET | Freq: Once | ORAL | 0 refills | Status: AC

## 2021-08-18 NOTE — Telephone Encounter (Signed)
Sent in RX for Valium

## 2021-08-24 ENCOUNTER — Other Ambulatory Visit: Payer: Medicare Other

## 2021-08-27 ENCOUNTER — Other Ambulatory Visit: Payer: Medicare Other

## 2021-08-28 ENCOUNTER — Ambulatory Visit
Admission: RE | Admit: 2021-08-28 | Discharge: 2021-08-28 | Disposition: A | Discharge status: Home / Self Care | Payer: Medicare Other | Source: Ambulatory Visit | Attending: Orthopedic Surgery | Admitting: Orthopedic Surgery

## 2021-08-28 ENCOUNTER — Other Ambulatory Visit: Payer: Self-pay

## 2021-08-28 DIAGNOSIS — M79604 Pain in right leg: Secondary | ICD-10-CM

## 2021-09-01 ENCOUNTER — Encounter: Payer: Self-pay | Admitting: Orthopedic Surgery

## 2021-09-05 ENCOUNTER — Other Ambulatory Visit: Payer: Medicare Other

## 2021-09-11 ENCOUNTER — Encounter: Payer: Self-pay | Admitting: Orthopedic Surgery

## 2021-09-11 ENCOUNTER — Ambulatory Visit: Payer: Medicare Other | Admitting: Orthopedic Surgery

## 2021-09-11 DIAGNOSIS — M79604 Pain in right leg: Secondary | ICD-10-CM | POA: Diagnosis not present

## 2021-09-11 NOTE — Progress Notes (Signed)
? ?Office Visit Note ?  ?Patient: Kristy Jacobs           ?Date of Birth: Feb 15, 1946           ?MRN: [id number] ?Visit Date: 09/11/2021 ?Requested by: Shanon Ace, MD ?[address]  ?[address] ?[place],  [place] [address] ?PCP: Shanon Ace, MD ? ?Subjective: ?Chief Complaint  ?Patient presents with  ? Right Leg - Pain, Follow-up  ?  MRI lumbar review  ? ? ?HPI: Kristy Jacobs is a 76 year old patient with low back pain and right knee pain.  She has known history of right knee arthritis.  CAD images of right knee arthritis demonstrates severe medial compartment arthritis with patellofemoral and lateral compartment arthritis also present.  She is doing aqua aerobics which has helped.  She also describes some right leg tingling down to the knee.  Standing hurts her more than walking.  She reports some very occasional weakness in the leg.  She has had MRI of the lumbar spine since she was last seen.  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 ?             ?ROS: All systems reviewed are negative as they relate to the chief complaint within the history of present illness.  Patient denies  fevers or chills. ? ? ?Assessment & Plan: ?Visit Diagnoses:  ?1. Pain in right leg   ? ? ?Plan: Impression is 2 problems with right knee arthritis and some stenosis and foraminal stenosis in the lumbar spine.  In general her lumbar spine MRI looks a lot better than she describes her clinical symptoms.  Right knee also has significant arthritis.  She is going to consider her options.  I think at this time her best option would be to undergo lumbar spine ESI for right-sided radiculopathy with Dr. Ernestina Patches.  She will consider knee replacement surgery.  Based on absence of objective weakness today knee replacement prior to lumbar spine surgery would be indicated.  She will  consider options and call Debbie if she wants to schedule. ? ?Follow-Up Instructions: No follow-ups on file.  ? ?Orders:  ?Orders Placed This Encounter  ?Procedures  ? Ambulatory referral to Physical Medicine Rehab  ? ?No orders of the defined types were placed in this encounter. ? ? ? ? Procedures: ?No procedures performed ? ? ?Clinical Data: ?No additional findings. ? ?Objective: ?Vital Signs: There were no vitals taken for this visit. ? ?Physical Exam:  ? ?Constitutional: Patient appears well-developed ?HEENT:  ?Head: Normocephalic ?Eyes:EOM are normal ?Neck: Normal range of motion ?Cardiovascular: Normal rate ?Pulmonary/chest: Effort normal ?Neurologic: Patient is alert ?Skin: Skin is warm ?Psychiatric: Patient has normal mood and affect ? ? ?Ortho Exam: Ortho exam demonstrates full active and passive range of motion of the ankle and hip.  On the right side she has mild effusion in the knee but good extensor function and stable collateral cruciate ligaments.  Bends to about 110.  No definite paresthesias L1 S1 bilaterally and the patient has 5 out of 5 ankle dorsiflexion plantarflexion quad hamstring strength with good hip flexion strength as well on both sides. ? ?Specialty Comments:  ?No specialty comments available. ? ?Imaging: ?No results found. ? ? ?PMFS History: ?Patient Active Problem List  ? Diagnosis Date Noted  ? Osteoarthritis of right knee 09/18/2020  ? Hallux rigidus of both  feet 09/18/2020  ? Allergy   ? Arthritis   ? Hypertension   ? ?Past Medical History:  ?Diagnosis Date  ? Allergy   ? Arthritis   ? Hypertension   ?  ?Family History  ?Problem Relation Age of Onset  ? Hypertension Mother   ? Hypertension Father   ? Cancer Father   ? Stroke Father   ? Hypertension Brother   ? Hypertension Sister   ?  ?Past Surgical History:  ?Procedure Laterality Date  ? NO PAST SURGERIES    ? ?Social History  ? ?Occupational History  ? Not on file  ?Tobacco Use  ? Smoking status: Never  ? Smokeless tobacco: Never   ?Substance and Sexual Activity  ? Alcohol use: Yes  ?  Alcohol/week: 2.0 standard drinks  ?  Types: 2 Glasses of wine per week  ? Drug use: No  ? Sexual activity: Yes  ? ? ? ? ? ?

## 2021-09-22 ENCOUNTER — Ambulatory Visit: Payer: Medicare Other | Admitting: Pulmonary Disease

## 2021-09-22 ENCOUNTER — Encounter: Payer: Self-pay | Admitting: Pulmonary Disease

## 2021-09-22 VITALS — BP 116/72 | HR 65 | Temp 97.7°F | Ht 64.0 in | Wt 160.8 lb

## 2021-09-22 DIAGNOSIS — R0602 Shortness of breath: Secondary | ICD-10-CM | POA: Diagnosis not present

## 2021-09-22 NOTE — Patient Instructions (Signed)
I will see you in about 6 months ? ?No changes to current medications ? ?Continue regular exercises ? ?Call with significant concerns ?

## 2021-09-22 NOTE — Progress Notes (Signed)
? ?      ?Kristy Jacobs    [id number]    05/06/1946 ? ?Primary Care Physician:Clark, Nettie Elm, MD ? ?Referring Physician: Shanon Ace, MD ?[address]  ?[address] ?[place],  [place] [address] ? ?Chief complaint: ?Being seen for shortness of breath ?Breathing remains about the same ? ?HPI: ? ?Breathing remains about the same ? ?Patient does get short of breath with moderate activity ? ?Does not feel she is limiting herself ? ?Does have back pain and knee discomfort on the right ? ?She is compliant with inhalers ? ?She continues to use Breo on a regular basis ?Rarely needs albuterol ? ?Flonase and Atrovent nasal for nasal stuffiness ? ?Has been doing about the same ?Limited more by arthritic knee problems on the right side ? ?PFT with severe obstructive disease ? ?Abnormal hypertensive response to exercise ? ?She has a bad right knee that limits activity ? ?She does have iron deficiency ? ?Never smoker ?Some exposure to secondhand smoke ? ?No occupational predisposition to lung disease ? ?Outpatient Encounter Medications as of 09/22/2021  ?Medication Sig  ? amLODipine (NORVASC) 2.5 MG tablet TAKE 1 TABLET BY MOUTH  DAILY  ? Ascorbic Acid (VITAMIN C PO) Take 500 mg by mouth daily.  ? Calcium Citrate (CAL-CITRATE PO) Take 1 tablet by mouth as directed.  ? fluticasone (FLONASE) 50 MCG/ACT nasal spray Place into both nostrils daily.  ? fluticasone furoate-vilanterol (BREO ELLIPTA) 100-25 MCG/ACT AEPB Inhale 1 puff into the lungs daily.  ? ipratropium (ATROVENT) 0.03 % nasal spray as needed.  ? Magnesium 500 MG CAPS Take 200 mg by mouth every other day.  ? montelukast (SINGULAIR) 10 MG tablet TAKE 1 TABLET BY MOUTH AT  BEDTIME  ? Multiple Vitamin (MULTIVITAMIN) tablet Take 1 tablet by mouth daily.  ? nystatin (MYCOSTATIN/NYSTOP) powder as needed.  ? Probiotic Product (PROBIOTIC PO) Take 1 tablet by mouth daily.  ? valsartan (DIOVAN) 320 MG tablet TAKE 1 TABLET BY MOUTH DAILY. Please keep upcoming  appt in March 2023 with Dr. Harrington Challenger before anymore refills. Thank you Final Attempt  ? ?No facility-administered encounter medications on file as of 09/22/2021.  ? ? ?Allergies as of 09/22/2021 - Review Complete 09/22/2021  ?Allergen Reaction Noted  ? Ace inhibitors Cough 02/28/2020  ? Compazine [prochlorperazine edisylate] Nausea And Vomiting 01/28/2014  ? Hctz [hydrochlorothiazide]  04/17/2020  ? ? ?Past Medical History:  ?Diagnosis Date  ? Allergy   ? Arthritis   ? Hypertension   ? ? ?Past Surgical History:  ?Procedure Laterality Date  ? NO PAST SURGERIES    ? ? ?Family History  ?Problem Relation Age of Onset  ? Hypertension Mother   ? Hypertension Father   ? Cancer Father   ? Stroke Father   ? Hypertension Brother   ? Hypertension Sister   ? ? ?Social History  ? ?Socioeconomic History  ? Marital status: Married  ?  Spouse name: Not on file  ? Number of children: Not on file  ? Years of education: Not on file  ? Highest education level: Not on file  ?Occupational History  ? Not on file  ?Tobacco Use  ? Smoking status: Never  ? Smokeless tobacco: Never  ?Substance and Sexual Activity  ? Alcohol use: Yes  ?  Alcohol/week: 2.0 standard drinks  ?  Types: 2 Glasses of wine per week  ? Drug use: No  ? Sexual activity: Yes  ?Other Topics Concern  ? Not on file  ?  Social History Narrative  ? Not on file  ? ?Social Determinants of Health  ? ?Financial Resource Strain: Not on file  ?Food Insecurity: Not on file  ?Transportation Needs: Not on file  ?Physical Activity: Not on file  ?Stress: Not on file  ?Social Connections: Not on file  ?Intimate Partner Violence: Not on file  ? ? ?Review of Systems  ?Respiratory:  Positive for shortness of breath. Negative for cough.   ?Cardiovascular:  Negative for chest pain.  ?All other systems reviewed and are negative. ? ?Vitals:  ? 09/22/21 0913  ?BP: 116/72  ?Pulse: 65  ?Temp: 97.7 ?F (36.5 ?C)  ?SpO2: 100%  ? ? ? ?Physical Exam ?Constitutional:   ?   Appearance: Normal appearance.   ?HENT:  ?   Nose: No congestion.  ?   Mouth/Throat:  ?   Mouth: Mucous membranes are moist.  ?   Pharynx: No posterior oropharyngeal erythema.  ?Eyes:  ?   General:     ?   Right eye: No discharge.     ?   Left eye: No discharge.  ?Cardiovascular:  ?   Rate and Rhythm: Normal rate and regular rhythm.  ?   Heart sounds: No murmur heard. ?  No friction rub.  ?Pulmonary:  ?   Effort: No respiratory distress.  ?   Breath sounds: No stridor. No wheezing.  ?Musculoskeletal:  ?   Cervical back: No rigidity or tenderness.  ?Neurological:  ?   Mental Status: She is alert.  ?Psychiatric:     ?   Mood and Affect: Mood normal.  ? ?Data Reviewed: ?Chest x-ray 03/11/2020-no infiltrative process noted on chest x-ray reviewed with the patient ? ?Exercise stress study reviewed ? ?PFT with severe obstructive disease with no significant bronchodilator response ? ?Assessment:  ? ?Shortness of breath with activity ?-This is remained stable ? ?Chronic obstructive pulmonary disease ?-Obstructive disease on PFT ? ?Alpha-1 levels within normal limits ? ?Singulair and Flonase to continue ? ?Encourage graded exercises ? ? ?Plan/Recommendations: ?Regular exams as tolerated ? ?No changes to inhalers ? ?Follow-up in 6 months ? ?Sherrilyn Rist MD ?[facility] ?09/22/2021, 9:30 AM ? ?CC: Shanon Ace,* ? ? ?

## 2021-10-15 ENCOUNTER — Encounter: Payer: Self-pay | Admitting: Physical Medicine and Rehabilitation

## 2021-10-15 ENCOUNTER — Ambulatory Visit: Payer: Medicare Other | Admitting: Physical Medicine and Rehabilitation

## 2021-10-15 ENCOUNTER — Ambulatory Visit: Payer: Self-pay

## 2021-10-15 VITALS — BP 169/91 | HR 99

## 2021-10-15 DIAGNOSIS — M48062 Spinal stenosis, lumbar region with neurogenic claudication: Secondary | ICD-10-CM

## 2021-10-15 DIAGNOSIS — M5416 Radiculopathy, lumbar region: Secondary | ICD-10-CM

## 2021-10-15 DIAGNOSIS — M4316 Spondylolisthesis, lumbar region: Secondary | ICD-10-CM

## 2021-10-15 MED ORDER — METHYLPREDNISOLONE ACETATE 80 MG/ML IJ SUSP
80.0000 mg | Freq: Once | INTRAMUSCULAR | Status: AC
  Administered 2021-10-15: 80 mg

## 2021-10-15 NOTE — Patient Instructions (Signed)

## 2021-10-15 NOTE — Progress Notes (Signed)
Pt state lower back pain that travels down her right leg. Pt state standing makes the pain worse. Pt state she takes over the counter pain meds to help ease her pain.  Numeric Pain Rating Scale and Functional Assessment Average Pain 2   In the last MONTH (on 0-10 scale) has pain interfered with the following?  1. General activity like being  able to carry out your everyday physical activities such as walking, climbing stairs, carrying groceries, or moving a chair?  Rating(9)   +Driver, -BT, -Dye Allergies.

## 2021-11-02 ENCOUNTER — Encounter: Payer: Self-pay | Admitting: Physical Medicine and Rehabilitation

## 2021-11-02 ENCOUNTER — Ambulatory Visit: Payer: Medicare Other | Admitting: Physical Medicine and Rehabilitation

## 2021-11-02 VITALS — BP 120/78 | HR 85

## 2021-11-02 DIAGNOSIS — M48062 Spinal stenosis, lumbar region with neurogenic claudication: Secondary | ICD-10-CM

## 2021-11-02 DIAGNOSIS — M1711 Unilateral primary osteoarthritis, right knee: Secondary | ICD-10-CM

## 2021-11-02 DIAGNOSIS — M4726 Other spondylosis with radiculopathy, lumbar region: Secondary | ICD-10-CM

## 2021-11-02 DIAGNOSIS — M4316 Spondylolisthesis, lumbar region: Secondary | ICD-10-CM | POA: Diagnosis not present

## 2021-11-02 DIAGNOSIS — M5416 Radiculopathy, lumbar region: Secondary | ICD-10-CM | POA: Diagnosis not present

## 2021-11-02 DIAGNOSIS — M47816 Spondylosis without myelopathy or radiculopathy, lumbar region: Secondary | ICD-10-CM

## 2021-11-02 NOTE — Procedures (Signed)
Lumbar Epidural Steroid Injection - Interlaminar Approach with Fluoroscopic Guidance  Patient: Kristy Jacobs      Date of Birth: September 19, 1945 MRN: [id number] PCP: Shanon Ace, MD      Visit Date: 10/15/2021   Universal Protocol:     Consent Given By: the patient  Position: PRONE  Additional Comments: Vital signs were monitored before and after the procedure. Patient was prepped and draped in the usual sterile fashion. The correct patient, procedure, and site was verified.   Injection Procedure Details:   Procedure diagnoses: Lumbar radiculopathy [M54.16]   Meds Administered:  Meds ordered this encounter  Medications   methylPREDNISolone acetate (DEPO-MEDROL) injection 80 mg     Laterality: Right  Location/Site:  L5-S1  Needle: 3.5 in., 20 ga. Tuohy  Needle Placement: Paramedian epidural  Findings:   -Comments: Excellent flow of contrast into the epidural space.  Procedure Details: Using a paramedian approach from the side mentioned above, the region overlying the inferior lamina was localized under fluoroscopic visualization and the soft tissues overlying this structure were infiltrated with 4 ml. of 1% Lidocaine without Epinephrine. The Tuohy needle was inserted into the epidural space using a paramedian approach.   The epidural space was localized using loss of resistance along with counter oblique bi-planar fluoroscopic views.  After negative aspirate for air, blood, and CSF, a 2 ml. volume of Isovue-250 was injected into the epidural space and the flow of contrast was observed. Radiographs were obtained for documentation purposes.    The injectate was administered into the level noted above.   Additional Comments:  The patient tolerated the procedure well Dressing: 2 x 2 sterile gauze and Band-Aid    Post-procedure details: Patient was observed during the procedure. Post-procedure instructions were reviewed.  Patient left the clinic in stable  condition.

## 2021-11-02 NOTE — Progress Notes (Unsigned)
Pt has hx of inj on 10/15/21 right [place] pt state it helped. Pt state her back and leg is doing great but she has tingling in her right knee. Pt state walking and standing for a long time makes the pain worse.  Numeric Pain Rating Scale and Functional Assessment Average Pain 4 Pain Right Now 1 My pain is intermittent and tingling Pain is worse with: walking, standing, and some activites Pain improves with: medication and injections   In the last MONTH (on 0-10 scale) has pain interfered with the following?  1. General activity like being  able to carry out your everyday physical activities such as walking, climbing stairs, carrying groceries, or moving a chair?  Rating(5)  2. Relation with others like being able to carry out your usual social activities and roles such as  activities at home, at work and in your community. Rating(6)  3. Enjoyment of life such that you have  been bothered by emotional problems such as feeling anxious, depressed or irritable?  Rating(7)

## 2021-11-02 NOTE — Progress Notes (Unsigned)
Kristy Jacobs - 76 y.o. female MRN [id number]  Date of birth: 08/29/1945  Office Visit Note: Visit Date: 11/02/2021 PCP: Shanon Ace, MD Referred by: Shanon Ace,*  Subjective: Chief Complaint  Patient presents with   Right Knee - Pain   HPI: Kristy Jacobs is a 76 y.o. female who comes in today for evaluation of chronic bilateral lower back pain radiating to buttock and right posterior thigh down to knee. Patient reports pain has been ongoing for several months and is exacerbated by prolonged standing and walking. She describes her pain as sore, aching and tingling sensation, currently rates as 1 out of 10. Patient reports some relief of pain with home exercise regimen, rest and use of medications. Patient states she is a very active person and is currently attending water aerobics classes at [facility]. Patients recent lumbar MRI exhibits multi-level facet hypertrophy, 9 mm anterolisthesis at L4-L5 and  moderate to severe spinal canal stenosis noted at this same level. Patient recently had right L5-S1 interlaminar epidural steroid injection performed in our office on 10/15/2021 and reports 90% relief of pain. Patient states increased functionality and ability to walk longer distances without taking breaks. Patient states intermittent sore and tingling sensation to right knee, however she reports pain is now much more manageable. Patient is currently being treated by Dr. Alphonzo Severance in our office for chronic right knee pain, per his notes recommends right total knee replacement. Patent states she is working with Dr. Randel Pigg team to schedule, would like to have surgery in the fall. Patient denies focal weakness. Patient denies recent trauma or falls.   Review of Systems  Musculoskeletal:  Positive for back pain.  Neurological:  Positive for tingling. Negative for sensory change, focal weakness and weakness.  All other systems reviewed and are negative. Otherwise per HPI.  Assessment &  Plan: Visit Diagnoses:    ICD-10-CM   1. Lumbar radiculopathy  M54.16     2. Other spondylosis with radiculopathy, lumbar region  M47.26     3. Spinal stenosis of lumbar region with neurogenic claudication  M48.062     4. Anterolisthesis of lumbar spine  M43.16     5. Facet hypertrophy of lumbar region  M47.816     6. Primary osteoarthritis of right knee  M17.11        Plan: Findings:  Chronic bilateral lower back pain radiating to buttock and right posterior thigh down to knee. Significant and sustained relief of pain with recent right L5-S1 interlaminar epidural steroid injection. Patient continues to manage pain with home exercise regimen, rest and use of medications. Patients clinical presentation and exam are consistent with neurogenic claudication. We believe the next step is to continue to monitor, patient instructed to let us know if her pain increases or changes in nature. Patient informed that we are able to repeat these injections infrequently if they continue to provide significant and sustained pain relief. Patient instructed to follow up with Dr. Marlou Sa for continued right knee issues and to discuss scheduling surgery. Patient encouraged to remain active and to continue exercise regimens as tolerated. No red flag symptoms noted upon exam today.    Meds & Orders: No orders of the defined types were placed in this encounter.  No orders of the defined types were placed in this encounter.   Follow-up: Return if symptoms worsen or fail to improve.   Procedures: No procedures performed      Clinical History: MRI LUMBAR SPINE WITHOUT CONTRAST  TECHNIQUE: Multiplanar, multisequence MR imaging of the lumbar spine was performed. No intravenous contrast was administered.   COMPARISON:  X-ray 07/29/2021   FINDINGS: Segmentation:  Standard.   Alignment:  9 mm grade 2 anterolisthesis L4 on L5.   Vertebrae: No fracture, evidence of discitis, or bone lesion. Discogenic  endplate marrow changes at L4-5 and L5-S1.   Conus medullaris and cauda equina: Conus extends to the L1-2 level. Conus and cauda equina appear normal.   Paraspinal and other soft tissues: No acute findings.   Disc levels:   Small disc bulges at T9-10, T10-11, and T11-12 within the included lower thoracic spine. Mild stenosis along the right side of the canal at T10-11 where there is also mild right-sided foraminal stenosis.   T12-L1: No significant disc protrusion, foraminal stenosis, or canal stenosis.   L1-L2: Shallow right paracentral disc protrusion. No foraminal or canal stenosis.   L2-L3: Mild annular disc bulge, slightly eccentric to the left. No foraminal or canal stenosis.   L3-L4: Mild annular disc bulge. Mild bilateral facet hypertrophy and ligamentum flavum buckling. No significant canal or foraminal stenosis.   L4-L5: Grade 2 anterolisthesis with disc uncovering and mild disc bulge. Severe bilateral facet arthropathy. Mild ligamentum flavum buckling. Findings result in moderate to severe canal stenosis with moderate to severe right and mild-to-moderate left foraminal stenosis.   L5-S1: Disc height loss with mild disc bulge and endplate ridging. Mild bilateral facet arthropathy. Mild right-sided subarticular recess stenosis. No canal stenosis. Mild-to-moderate bilateral foraminal stenosis.   IMPRESSION: 1. 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 2. Mild-to-moderate bilateral foraminal stenosis at L5-S1.     Electronically Signed   By: Davina Poke D.O.   On: 08/30/2021 14:04   She reports that she has never smoked. She has never used smokeless tobacco. No results for input(s): HGBA1C, LABURIC in the last 8760 hours.  Objective:  VS:  HT:    WT:   BMI:     BP:120/78  HR:85bpm  TEMP: ( )  RESP:  Physical Exam Vitals and nursing  note reviewed.  HENT:     Head: Normocephalic and atraumatic.     Right Ear: External ear normal.     Left Ear: External ear normal.     Nose: Nose normal.     Mouth/Throat:     Mouth: Mucous membranes are moist.  Eyes:     Extraocular Movements: Extraocular movements intact.  Cardiovascular:     Rate and Rhythm: Normal rate.     Pulses: Normal pulses.  Pulmonary:     Effort: Pulmonary effort is normal.  Abdominal:     General: Abdomen is flat. There is no distension.  Musculoskeletal:        General: Tenderness present.     Cervical back: Normal range of motion.     Comments: Pt rises from seated position to standing without difficulty. Good lumbar range of motion. Strong distal strength without clonus, no pain upon palpation of greater trochanters. Sensation intact bilaterally. Walks independently, gait steady.   Skin:    General: Skin is warm and dry.     Capillary Refill: Capillary refill takes less than 2 seconds.  Neurological:     General: No focal deficit present.     Mental Status: She is alert and oriented to person, place, and time.  Psychiatric:        Mood and Affect: Mood normal.  Behavior: Behavior normal.    Ortho Exam  Imaging: No results found.  Past Medical/Family/Surgical/Social History: Medications & Allergies reviewed per EMR, new medications updated. Patient Active Problem List   Diagnosis Date Noted   Osteoarthritis of right knee 09/18/2020   Hallux rigidus of both feet 09/18/2020   Allergy    Arthritis    Hypertension    Past Medical History:  Diagnosis Date   Allergy    Arthritis    Hypertension    Family History  Problem Relation Age of Onset   Hypertension Mother    Hypertension Father    Cancer Father    Stroke Father    Hypertension Brother    Hypertension Sister    Past Surgical History:  Procedure Laterality Date   NO PAST SURGERIES     Social History   Occupational History   Not on file  Tobacco Use   Smoking  status: Never   Smokeless tobacco: Never  Substance and Sexual Activity   Alcohol use: Yes    Alcohol/week: 2.0 standard drinks    Types: 2 Glasses of wine per week   Drug use: No   Sexual activity: Yes

## 2021-11-02 NOTE — Progress Notes (Signed)
Kristy Jacobs - 76 y.o. female MRN [id number]  Date of birth: 1945/08/12  Office Visit Note: Visit Date: 10/15/2021 PCP: Shanon Ace, MD Referred by: Shanon Ace,*  Subjective: Chief Complaint  Patient presents with   Lower Back - Pain   Right Leg - Pain   HPI:  Kristy Jacobs is a 76 y.o. female who comes in today at the request of Dr. Anderson Malta for planned Right L5-S1 Lumbar Interlaminar epidural steroid injection with fluoroscopic guidance.  The patient has failed conservative care including home exercise, medications, time and activity modification.  This injection will be diagnostic and hopefully therapeutic.  Please see requesting physician notes for further details and justification. MRI reviewed with images and spine model.  MRI reviewed in the note below.  Spoke with her at length today about her symptoms and spine.  We will try basic interlaminar epidural injection.  We will have her follow-up in a couple weeks or so to see how she is doing.  Depending on relief would look at transforaminal injection versus repeat interlaminar injection.  Patient has significant listhesis and stenosis.  ROS Otherwise per HPI.  Assessment & Plan: Visit Diagnoses:    ICD-10-CM   1. Lumbar radiculopathy  M54.16 XR C-ARM NO REPORT    Epidural Steroid injection    methylPREDNISolone acetate (DEPO-MEDROL) injection 80 mg    2. Spinal stenosis of lumbar region with neurogenic claudication  M48.062     3. Spondylolisthesis of lumbar region  M43.16       Plan: No additional findings.   Meds & Orders:  Meds ordered this encounter  Medications   methylPREDNISolone acetate (DEPO-MEDROL) injection 80 mg    Orders Placed This Encounter  Procedures   XR C-ARM NO REPORT   Epidural Steroid injection    Follow-up: Return in about 2 weeks (around 10/29/2021).   Procedures: No procedures performed  Lumbar Epidural Steroid Injection - Interlaminar Approach with Fluoroscopic  Guidance  Patient: Kristy Jacobs      Date of Birth: 30-Mar-1946 MRN: [id number] PCP: Shanon Ace, MD      Visit Date: 10/15/2021   Universal Protocol:     Consent Given By: the patient  Position: PRONE  Additional Comments: Vital signs were monitored before and after the procedure. Patient was prepped and draped in the usual sterile fashion. The correct patient, procedure, and site was verified.   Injection Procedure Details:   Procedure diagnoses: Lumbar radiculopathy [M54.16]   Meds Administered:  Meds ordered this encounter  Medications   methylPREDNISolone acetate (DEPO-MEDROL) injection 80 mg     Laterality: Right  Location/Site:  L5-S1  Needle: 3.5 in., 20 ga. Tuohy  Needle Placement: Paramedian epidural  Findings:   -Comments: Excellent flow of contrast into the epidural space.  Procedure Details: Using a paramedian approach from the side mentioned above, the region overlying the inferior lamina was localized under fluoroscopic visualization and the soft tissues overlying this structure were infiltrated with 4 ml. of 1% Lidocaine without Epinephrine. The Tuohy needle was inserted into the epidural space using a paramedian approach.   The epidural space was localized using loss of resistance along with counter oblique bi-planar fluoroscopic views.  After negative aspirate for air, blood, and CSF, a 2 ml. volume of Isovue-250 was injected into the epidural space and the flow of contrast was observed. Radiographs were obtained for documentation purposes.    The injectate was administered into the level noted above.   Additional Comments:  The patient tolerated the procedure well Dressing: 2 x 2 sterile gauze and Band-Aid    Post-procedure details: Patient was observed during the procedure. Post-procedure instructions were reviewed.  Patient left the clinic in stable condition.   Clinical History: MRI LUMBAR SPINE WITHOUT CONTRAST    TECHNIQUE: Multiplanar, multisequence MR imaging of the lumbar spine was performed. No intravenous contrast was administered.   COMPARISON:  X-ray 07/29/2021   FINDINGS: Segmentation:  Standard.   Alignment:  9 mm grade 2 anterolisthesis L4 on L5.   Vertebrae: No fracture, evidence of discitis, or bone lesion. Discogenic endplate marrow changes at L4-5 and L5-S1.   Conus medullaris and cauda equina: Conus extends to the L1-2 level. Conus and cauda equina appear normal.   Paraspinal and other soft tissues: No acute findings.   Disc levels:   Small disc bulges at T9-10, T10-11, and T11-12 within the included lower thoracic spine. Mild stenosis along the right side of the canal at T10-11 where there is also mild right-sided foraminal stenosis.   T12-L1: No significant disc protrusion, foraminal stenosis, or canal stenosis.   L1-L2: Shallow right paracentral disc protrusion. No foraminal or canal stenosis.   L2-L3: Mild annular disc bulge, slightly eccentric to the left. No foraminal or canal stenosis.   L3-L4: Mild annular disc bulge. Mild bilateral facet hypertrophy and ligamentum flavum buckling. No significant canal or foraminal stenosis.   L4-L5: Grade 2 anterolisthesis with disc uncovering and mild disc bulge. Severe bilateral facet arthropathy. Mild ligamentum flavum buckling. Findings result in moderate to severe canal stenosis with moderate to severe right and mild-to-moderate left foraminal stenosis.   L5-S1: Disc height loss with mild disc bulge and endplate ridging. Mild bilateral facet arthropathy. Mild right-sided subarticular recess stenosis. No canal stenosis. Mild-to-moderate bilateral foraminal stenosis.   IMPRESSION: 1. 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 2. Mild-to-moderate bilateral foraminal stenosis at L5-S1.      Electronically Signed   By: Davina Poke D.O.   On: 08/30/2021 14:04     Objective:  VS:  HT:    WT:   BMI:     BP:(!) 169/91  HR:99bpm  TEMP: ( )  RESP:  Physical Exam Vitals and nursing note reviewed.  Constitutional:      General: She is not in acute distress.    Appearance: Normal appearance. She is not ill-appearing.  HENT:     Head: Normocephalic and atraumatic.     Right Ear: External ear normal.     Left Ear: External ear normal.  Eyes:     Extraocular Movements: Extraocular movements intact.  Cardiovascular:     Rate and Rhythm: Normal rate.     Pulses: Normal pulses.  Pulmonary:     Effort: Pulmonary effort is normal. No respiratory distress.  Abdominal:     General: There is no distension.     Palpations: Abdomen is soft.  Musculoskeletal:        General: Tenderness present.     Cervical back: Neck supple.     Right lower leg: No edema.     Left lower leg: No edema.     Comments: Patient has good distal strength with no pain over the greater trochanters.  No clonus or focal weakness.  Skin:    Findings: No erythema, lesion or rash.  Neurological:     General: No focal deficit present.     Mental Status: She is alert  and oriented to person, place, and time.     Sensory: No sensory deficit.     Motor: No weakness or abnormal muscle tone.     Coordination: Coordination normal.  Psychiatric:        Mood and Affect: Mood normal.        Behavior: Behavior normal.     Imaging: No results found.

## 2021-12-03 ENCOUNTER — Other Ambulatory Visit: Payer: Self-pay | Admitting: Pulmonary Disease

## 2021-12-29 ENCOUNTER — Other Ambulatory Visit: Payer: Self-pay | Admitting: Internal Medicine

## 2022-04-04 ENCOUNTER — Other Ambulatory Visit: Payer: Self-pay | Admitting: Pulmonary Disease

## 2022-04-14 ENCOUNTER — Encounter: Payer: Self-pay | Admitting: Pulmonary Disease

## 2022-04-14 ENCOUNTER — Ambulatory Visit: Payer: Medicare Other | Admitting: Pulmonary Disease

## 2022-04-14 VITALS — BP 120/78 | HR 81 | Temp 98.2°F | Ht 64.0 in | Wt 155.0 lb

## 2022-04-14 DIAGNOSIS — J449 Chronic obstructive pulmonary disease, unspecified: Secondary | ICD-10-CM | POA: Diagnosis not present

## 2022-04-14 NOTE — Patient Instructions (Addendum)
Graded exercises as tolerated  Continue Breo -You can try and stop Breo whenever you are in a part of the year when your symptoms are most controlled  RSV shot is beneficial

## 2022-04-14 NOTE — Progress Notes (Signed)
Kristy Jacobs    [id number]    Nov 30, 1945  Primary Care Physician:Clark, Nettie Elm, MD  Referring Physician: Shanon Ace, MD [address]  [address] [place],  [place] [address]  Chief complaint: Being seen for shortness of breath Breathing remains about the same  HPI:  Breathing feels about the same  Has been using Breo Does not feel she feels any different with using Breo  Did have knee surgery about 7 weeks ago and seems to be recovering well  Continues to be compliant with inhalers  She does use Atrovent nasal and Flonase for nasal stuffiness  Rarely needs albuterol  PFT with severe obstructive disease  Abnormal hypertensive response to exercise  She does have iron deficiency  Never smoker Some exposure to secondhand smoke  No occupational predisposition to lung disease  Outpatient Encounter Medications as of 04/14/2022  Medication Sig   amLODipine (NORVASC) 2.5 MG tablet TAKE 1 TABLET BY MOUTH DAILY   fluticasone (FLONASE) 50 MCG/ACT nasal spray Place into both nostrils daily.   fluticasone furoate-vilanterol (BREO ELLIPTA) 100-25 MCG/ACT AEPB Inhale 1 puff into the lungs daily.   ipratropium (ATROVENT) 0.03 % nasal spray as needed.   Magnesium 500 MG CAPS Take 200 mg by mouth every other day.   montelukast (SINGULAIR) 10 MG tablet TAKE 1 TABLET BY MOUTH AT  BEDTIME   nystatin (MYCOSTATIN/NYSTOP) powder as needed.   Probiotic Product (PROBIOTIC PO) Take 1 tablet by mouth daily.   valsartan (DIOVAN) 320 MG tablet TAKE 1 TABLET BY MOUTH DAILY. Please keep upcoming appt in March 2023 with Dr. Harrington Challenger before anymore refills. Thank you Final Attempt   [DISCONTINUED] Ascorbic Acid (VITAMIN C PO) Take 500 mg by mouth daily.   [DISCONTINUED] Calcium Citrate (CAL-CITRATE PO) Take 1 tablet by mouth as directed.   [DISCONTINUED] Multiple Vitamin (MULTIVITAMIN) tablet Take 1 tablet by mouth daily.   No facility-administered encounter  medications on file as of 04/14/2022.    Allergies as of 04/14/2022 - Review Complete 04/14/2022  Allergen Reaction Noted   Ace inhibitors Cough 02/28/2020   Compazine [prochlorperazine edisylate] Nausea And Vomiting 01/28/2014   Hctz [hydrochlorothiazide]  04/17/2020    Past Medical History:  Diagnosis Date   Allergy    Arthritis    Hypertension     Past Surgical History:  Procedure Laterality Date   NO PAST SURGERIES      Family History  Problem Relation Age of Onset   Hypertension Mother    Hypertension Father    Cancer Father    Stroke Father    Hypertension Brother    Hypertension Sister     Social History   Socioeconomic History   Marital status: Married    Spouse name: Not on file   Number of children: Not on file   Years of education: Not on file   Highest education level: Not on file  Occupational History   Not on file  Tobacco Use   Smoking status: Never   Smokeless tobacco: Never  Substance and Sexual Activity   Alcohol use: Yes    Alcohol/week: 2.0 standard drinks of alcohol    Types: 2 Glasses of wine per week   Drug use: No   Sexual activity: Yes  Other Topics Concern   Not on file  Social History Narrative   Not on file   Social Determinants of Health   Financial Resource Strain: Not on file  Food Insecurity: Not on  file  Transportation Needs: Not on file  Physical Activity: Not on file  Stress: Not on file  Social Connections: Not on file  Intimate Partner Violence: Not on file    Review of Systems  Respiratory:  Positive for shortness of breath. Negative for cough.   Cardiovascular:  Negative for chest pain.  All other systems reviewed and are negative.   There were no vitals filed for this visit.    Physical Exam Constitutional:      Appearance: Normal appearance.  HENT:     Head: Normocephalic.     Nose: No congestion.     Mouth/Throat:     Mouth: Mucous membranes are moist.     Pharynx: No posterior oropharyngeal  erythema.  Eyes:     General:        Right eye: No discharge.        Left eye: No discharge.  Cardiovascular:     Rate and Rhythm: Normal rate and regular rhythm.     Heart sounds: No murmur heard.    No friction rub.  Pulmonary:     Effort: No respiratory distress.     Breath sounds: No stridor. No wheezing.  Musculoskeletal:     Cervical back: No rigidity or tenderness.  Neurological:     Mental Status: She is alert.  Psychiatric:        Mood and Affect: Mood normal.    Data Reviewed: Chest x-ray 03/11/2020-no infiltrative process noted on chest x-ray reviewed with the patient  Exercise stress study reviewed  PFT with severe obstructive disease with no significant bronchodilator response  Assessment:   Shortness of breath with activity -This remains stable  Chronic obstructive pulmonary disease -Obstructive defect on PFT  She feels Memory Dance is not really making much of a difference, she does not feel limited and does not feel significant shortness of breath We did discuss possibility of holding Breo for a few weeks to see whether symptoms remain stable without it -She will try to do this at some point in the future    Plan/Recommendations: Continue current inhalers  Graded activities as tolerated  Follow-up in 6 months    Sherrilyn Rist MD [facility] 04/14/2022, 11:55 AM  CC: Shanon Ace,*

## 2022-04-15 ENCOUNTER — Other Ambulatory Visit: Payer: Self-pay | Admitting: Internal Medicine

## 2022-05-13 ENCOUNTER — Telehealth: Payer: Self-pay | Admitting: Physical Medicine and Rehabilitation

## 2022-05-13 ENCOUNTER — Other Ambulatory Visit: Payer: Self-pay | Admitting: Physical Medicine and Rehabilitation

## 2022-05-13 DIAGNOSIS — M5416 Radiculopathy, lumbar region: Secondary | ICD-10-CM

## 2022-05-13 NOTE — Telephone Encounter (Signed)
Pt called and would like to repeat injection. Worked up until now!  Right L5-S1 Lumbar Interlaminar epidural steroid injection with fluoroscopic guidance.

## 2022-05-14 ENCOUNTER — Telehealth: Payer: Self-pay | Admitting: Physical Medicine and Rehabilitation

## 2022-05-14 NOTE — Telephone Encounter (Signed)
Spoke with patient and informed her that we are still waiting for insurance to authorize the injection. Verbalized understanding

## 2022-05-14 NOTE — Telephone Encounter (Signed)
Pt called to set an appt. Please call pt at [phone].

## 2022-05-27 ENCOUNTER — Ambulatory Visit: Payer: Medicare Other | Admitting: Physical Medicine and Rehabilitation

## 2022-05-27 ENCOUNTER — Ambulatory Visit: Payer: Self-pay

## 2022-05-27 VITALS — BP 134/84 | HR 69

## 2022-05-27 DIAGNOSIS — M5416 Radiculopathy, lumbar region: Secondary | ICD-10-CM

## 2022-05-27 MED ORDER — METHYLPREDNISOLONE ACETATE 80 MG/ML IJ SUSP
40.0000 mg | Freq: Once | INTRAMUSCULAR | Status: AC
  Administered 2022-05-27: 40 mg

## 2022-05-27 NOTE — Progress Notes (Signed)
Numeric Pain Rating Scale and Functional Assessment Average Pain 7   In the last MONTH (on 0-10 scale) has pain interfered with the following?  1. General activity like being  able to carry out your everyday physical activities such as walking, climbing stairs, carrying groceries, or moving a chair?  Rating(7)   +Driver, -BT, -Dye Allergies.  Lower back pain with radiation into right leg to the back of the knee. Standing makes pain worse, but gets better with sitting

## 2022-05-27 NOTE — Patient Instructions (Signed)

## 2022-06-02 NOTE — Procedures (Signed)
Lumbar Epidural Steroid Injection - Interlaminar Approach with Fluoroscopic Guidance  Patient: Kristy Jacobs      Date of Birth: [birth year] MRN: [id number] PCP: Shanon Ace, MD      Visit Date: 05/27/2022   Universal Protocol:     Consent Given By: the patient  Position: PRONE  Additional Comments: Vital signs were monitored before and after the procedure. Patient was prepped and draped in the usual sterile fashion. The correct patient, procedure, and site was verified.   Injection Procedure Details:   Procedure diagnoses: Lumbar radiculopathy [M54.16]   Meds Administered:  Meds ordered this encounter  Medications   methylPREDNISolone acetate (DEPO-MEDROL) injection 40 mg     Laterality: Right  Location/Site:  L5-S1  Needle: 3.5 in., 20 ga. Tuohy  Needle Placement: Paramedian epidural  Findings:   -Comments: Excellent flow of contrast into the epidural space.  Procedure Details: Using a paramedian approach from the side mentioned above, the region overlying the inferior lamina was localized under fluoroscopic visualization and the soft tissues overlying this structure were infiltrated with 4 ml. of 1% Lidocaine without Epinephrine. The Tuohy needle was inserted into the epidural space using a paramedian approach.   The epidural space was localized using loss of resistance along with counter oblique bi-planar fluoroscopic views.  After negative aspirate for air, blood, and CSF, a 2 ml. volume of Isovue-250 was injected into the epidural space and the flow of contrast was observed. Radiographs were obtained for documentation purposes.    The injectate was administered into the level noted above.   Additional Comments:  No complications occurred Dressing: 2 x 2 sterile gauze and Band-Aid    Post-procedure details: Patient was observed during the procedure. Post-procedure instructions were reviewed.  Patient left the clinic in stable condition.

## 2022-06-02 NOTE — Progress Notes (Signed)
Kristy Jacobs - 76 y.o. female MRN [id number]  Date of birth: [birth year]  Office Visit Note: Visit Date: 05/27/2022 PCP: Shanon Ace, MD Referred by: Shanon Ace,*  Subjective: Chief Complaint  Patient presents with   Lower Back - Pain   HPI:  Kristy Jacobs is a 76 y.o. female who comes in today for planned repeat Right L5-S1  Lumbar Interlaminar epidural steroid injection with fluoroscopic guidance.  The patient has failed conservative care including home exercise, medications, time and activity modification.  This injection will be diagnostic and hopefully therapeutic.  Please see requesting physician notes for further details and justification. Patient received more than 50% pain relief from prior injection.   Referring: Dr. Anderson Malta and Barnet Pall, FNP   ROS Otherwise per HPI.  Assessment & Plan: Visit Diagnoses:    ICD-10-CM   1. Lumbar radiculopathy  M54.16 XR C-ARM NO REPORT    Epidural Steroid injection    methylPREDNISolone acetate (DEPO-MEDROL) injection 40 mg      Plan: No additional findings.   Meds & Orders:  Meds ordered this encounter  Medications   methylPREDNISolone acetate (DEPO-MEDROL) injection 40 mg    Orders Placed This Encounter  Procedures   XR C-ARM NO REPORT   Epidural Steroid injection    Follow-up: Return for visit to requesting provider as needed.   Procedures: No procedures performed  Lumbar Epidural Steroid Injection - Interlaminar Approach with Fluoroscopic Guidance  Patient: Kristy Jacobs      Date of Birth: 11/12/1945 MRN: [id number] PCP: Shanon Ace, MD      Visit Date: 05/27/2022   Universal Protocol:     Consent Given By: the patient  Position: PRONE  Additional Comments: Vital signs were monitored before and after the procedure. Patient was prepped and draped in the usual sterile fashion. The correct patient, procedure, and site was verified.   Injection Procedure Details:    Procedure diagnoses: Lumbar radiculopathy [M54.16]   Meds Administered:  Meds ordered this encounter  Medications   methylPREDNISolone acetate (DEPO-MEDROL) injection 40 mg     Laterality: Right  Location/Site:  L5-S1  Needle: 3.5 in., 20 ga. Tuohy  Needle Placement: Paramedian epidural  Findings:   -Comments: Excellent flow of contrast into the epidural space.  Procedure Details: Using a paramedian approach from the side mentioned above, the region overlying the inferior lamina was localized under fluoroscopic visualization and the soft tissues overlying this structure were infiltrated with 4 ml. of 1% Lidocaine without Epinephrine. The Tuohy needle was inserted into the epidural space using a paramedian approach.   The epidural space was localized using loss of resistance along with counter oblique bi-planar fluoroscopic views.  After negative aspirate for air, blood, and CSF, a 2 ml. volume of Isovue-250 was injected into the epidural space and the flow of contrast was observed. Radiographs were obtained for documentation purposes.    The injectate was administered into the level noted above.   Additional Comments:  No complications occurred Dressing: 2 x 2 sterile gauze and Band-Aid    Post-procedure details: Patient was observed during the procedure. Post-procedure instructions were reviewed.  Patient left the clinic in stable condition.   Clinical History: MRI LUMBAR SPINE WITHOUT CONTRAST   TECHNIQUE: Multiplanar, multisequence MR imaging of the lumbar spine was performed. No intravenous contrast was administered.   COMPARISON:  X-ray 07/29/2021   FINDINGS: Segmentation:  Standard.   Alignment:  9 mm grade 2 anterolisthesis L4 on L5.  Vertebrae: No fracture, evidence of discitis, or bone lesion. Discogenic endplate marrow changes at L4-5 and L5-S1.   Conus medullaris and cauda equina: Conus extends to the L1-2 level. Conus and cauda equina appear  normal.   Paraspinal and other soft tissues: No acute findings.   Disc levels:   Small disc bulges at T9-10, T10-11, and T11-12 within the included lower thoracic spine. Mild stenosis along the right side of the canal at T10-11 where there is also mild right-sided foraminal stenosis.   T12-L1: No significant disc protrusion, foraminal stenosis, or canal stenosis.   L1-L2: Shallow right paracentral disc protrusion. No foraminal or canal stenosis.   L2-L3: Mild annular disc bulge, slightly eccentric to the left. No foraminal or canal stenosis.   L3-L4: Mild annular disc bulge. Mild bilateral facet hypertrophy and ligamentum flavum buckling. No significant canal or foraminal stenosis.   L4-L5: Grade 2 anterolisthesis with disc uncovering and mild disc bulge. Severe bilateral facet arthropathy. Mild ligamentum flavum buckling. Findings result in moderate to severe canal stenosis with moderate to severe right and mild-to-moderate left foraminal stenosis.   L5-S1: Disc height loss with mild disc bulge and endplate ridging. Mild bilateral facet arthropathy. Mild right-sided subarticular recess stenosis. No canal stenosis. Mild-to-moderate bilateral foraminal stenosis.   IMPRESSION: 1. 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 2. Mild-to-moderate bilateral foraminal stenosis at L5-S1.     Electronically Signed   By: Davina Poke D.O.   On: 08/30/2021 14:04     Objective:  VS:  HT:    WT:   BMI:     BP:134/84  HR:69bpm  TEMP: ( )  RESP:  Physical Exam Vitals and nursing note reviewed.  Constitutional:      General: She is not in acute distress.    Appearance: Normal appearance. She is not ill-appearing.  HENT:     Head: Normocephalic and atraumatic.     Right Ear: External ear normal.     Left Ear: External ear normal.  Eyes:     Extraocular  Movements: Extraocular movements intact.  Cardiovascular:     Rate and Rhythm: Normal rate.     Pulses: Normal pulses.  Pulmonary:     Effort: Pulmonary effort is normal. No respiratory distress.  Abdominal:     General: There is no distension.     Palpations: Abdomen is soft.  Musculoskeletal:        General: Tenderness present.     Cervical back: Neck supple.     Right lower leg: No edema.     Left lower leg: No edema.     Comments: Patient has good distal strength with no pain over the greater trochanters.  No clonus or focal weakness.  Skin:    Findings: No erythema, lesion or rash.  Neurological:     General: No focal deficit present.     Mental Status: She is alert and oriented to person, place, and time.     Sensory: No sensory deficit.     Motor: No weakness or abnormal muscle tone.     Coordination: Coordination normal.  Psychiatric:        Mood and Affect: Mood normal.        Behavior: Behavior normal.      Imaging: No results found.

## 2022-06-19 ENCOUNTER — Other Ambulatory Visit: Payer: Self-pay | Admitting: Internal Medicine

## 2022-06-25 ENCOUNTER — Emergency Department (HOSPITAL_BASED_OUTPATIENT_CLINIC_OR_DEPARTMENT_OTHER): Payer: Medicare HMO

## 2022-06-25 ENCOUNTER — Other Ambulatory Visit: Payer: Self-pay

## 2022-06-25 ENCOUNTER — Emergency Department (HOSPITAL_BASED_OUTPATIENT_CLINIC_OR_DEPARTMENT_OTHER)
Admission: EM | Admit: 2022-06-25 | Discharge: 2022-06-25 | Disposition: A | Discharge status: Home / Self Care | Payer: Medicare HMO | Attending: Emergency Medicine | Admitting: Emergency Medicine

## 2022-06-25 ENCOUNTER — Encounter (HOSPITAL_BASED_OUTPATIENT_CLINIC_OR_DEPARTMENT_OTHER): Payer: Self-pay

## 2022-06-25 DIAGNOSIS — I4510 Unspecified right bundle-branch block: Secondary | ICD-10-CM | POA: Insufficient documentation

## 2022-06-25 DIAGNOSIS — Z79899 Other long term (current) drug therapy: Secondary | ICD-10-CM | POA: Diagnosis not present

## 2022-06-25 DIAGNOSIS — M79601 Pain in right arm: Secondary | ICD-10-CM | POA: Insufficient documentation

## 2022-06-25 DIAGNOSIS — I440 Atrioventricular block, first degree: Secondary | ICD-10-CM | POA: Insufficient documentation

## 2022-06-25 DIAGNOSIS — J449 Chronic obstructive pulmonary disease, unspecified: Secondary | ICD-10-CM | POA: Insufficient documentation

## 2022-06-25 DIAGNOSIS — I10 Essential (primary) hypertension: Secondary | ICD-10-CM | POA: Insufficient documentation

## 2022-06-25 LAB — BASIC METABOLIC PANEL
Anion gap: 9 (ref 5–15)
BUN: 16 mg/dL (ref 8–23)
CO2: 24 mmol/L (ref 22–32)
Calcium: 9 mg/dL (ref 8.9–10.3)
Chloride: 103 mmol/L (ref 98–111)
Creatinine, Ser: 0.87 mg/dL (ref 0.44–1.00)
GFR, Estimated: >60 mL/min (ref >60)
Glucose, Bld: 108 mg/dL — ABNORMAL HIGH (ref 70–99)
Potassium: 3.8 mmol/L (ref 3.5–5.1)
Sodium: 136 mmol/L (ref 135–145)

## 2022-06-25 LAB — TROPONIN I (HIGH SENSITIVITY)
Troponin I (High Sensitivity): 3 ng/L (ref <18)
Troponin I (High Sensitivity): 3 ng/L (ref <18)

## 2022-06-25 LAB — CBC
HCT: 39 % (ref 36.0–46.0)
Hemoglobin: 11.7 g/dL — ABNORMAL LOW (ref 12.0–15.0)
MCH: 22.4 pg — ABNORMAL LOW (ref 26.0–34.0)
MCHC: 30 g/dL (ref 30.0–36.0)
MCV: 74.7 fL — ABNORMAL LOW (ref 80.0–100.0)
Platelets: 421 10*3/uL — ABNORMAL HIGH (ref 150–400)
RBC: 5.22 MIL/uL — ABNORMAL HIGH (ref 3.87–5.11)
RDW: 18.9 % — ABNORMAL HIGH (ref 11.5–15.5)
WBC: 7 10*3/uL (ref 4.0–10.5)
nRBC: 0 % (ref 0.0–0.2)

## 2022-06-25 NOTE — ED Provider Notes (Signed)
[facility] Provider Note   CSN: [id number] Arrival date & time: 06/25/22  1804     History {Add pertinent medical, surgical, social history, OB history to HPI:1} Chief Complaint  Patient presents with   Chest Pain    Kristy Jacobs is a 77 y.o. female.  HPI     77yo female with history of hypertension, iron deficiency, who presents with concern for right arm pain.  Right arm pain has been coming and going over more than a week.  Achy pain.  If straightening it and relaxing it feels better. Sometimes it won't hurt all day then will lay down and feel it. Sometimes tylenol works, sometimes not, can't really see something that makes it better or makes it worse.   Not exertional. Not worse after eating. Sometimes worse with arm movements but sometimes not.  PCP worried ecg different No chest pain No shortness of breath No numbness or weakness, no neck pain No nausea or vomiting No leg pain or swelling or arm swelling No abdominal pain  No fever, no cough.  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 Dr. Harrington Challenger Cardiology, going to see her end of the month No smoking, no other drugs, occ etoh Htn in family, no cad hx    Past Medical History:  Diagnosis Date   Allergy    Arthritis    Hypertension      Home Medications Prior to Admission medications   Medication Sig Start Date End Date Taking? Authorizing Provider  amLODipine (NORVASC) 2.5 MG tablet TAKE 1 TABLET BY MOUTH DAILY 12/30/21   Fay Records, MD  fluticasone [facility]) 50 MCG/ACT nasal spray Place into both nostrils daily.    [provider]  fluticasone furoate-vilanterol (BREO ELLIPTA) 100-25 MCG/ACT AEPB Inhale 1 puff into the lungs daily. 04/16/21   Olalere, Cicero Duck A, MD  ipratropium (ATROVENT) 0.03 % nasal spray as needed. 05/15/21   [provider]  Magnesium 500 MG CAPS Take 200 mg by mouth every other day.    [provider]  montelukast  (SINGULAIR) 10 MG tablet TAKE 1 TABLET BY MOUTH AT  BEDTIME 12/04/21   Olalere, Adewale A, MD  nystatin (MYCOSTATIN/NYSTOP) powder as needed.    [provider]  Probiotic Product (PROBIOTIC PO) Take 1 tablet by mouth daily.    [provider]  valsartan (DIOVAN) 320 MG tablet TAKE 1 TABLET BY MOUTH DAILY 06/21/22   Fay Records, MD      Allergies    Ace inhibitors, Compazine [prochlorperazine edisylate], and Hctz [hydrochlorothiazide]    Review of Systems   Review of Systems  Physical Exam Updated Vital Signs BP (!) 177/87   Pulse 83   Temp 98 F (36.7 C) (Oral)   Resp 17   Ht '5\' 4"'$  (1.626 m)   Wt 70.3 kg   SpO2 100%   BMI 26.61 kg/m  Physical Exam  ED Results / Procedures / Treatments   Labs (all labs ordered are listed, but only abnormal results are displayed) Labs Reviewed  BASIC METABOLIC PANEL - Abnormal; Notable for the following components:      Result Value   Glucose, Bld 108 (*)    All other components within normal limits  CBC - Abnormal; Notable for the following components:   RBC 5.22 (*)    Hemoglobin 11.7 (*)    MCV 74.7 (*)    MCH 22.4 (*)    RDW 18.9 (*)  Platelets 421 (*)    All other components within normal limits  TROPONIN I (HIGH SENSITIVITY)  TROPONIN I (HIGH SENSITIVITY)    EKG EKG Interpretation  Date/Time:  Friday June 25 2022 18:25:24 EST Ventricular Rate:  88 PR Interval:  246 QRS Duration: 138 QT Interval:  386 QTC Calculation: 467 R Axis:   -73 Text Interpretation: Sinus rhythm with 1st degree A-V block Right bundle branch block Left anterior fascicular block *** Bifascicular block *** Possible Lateral infarct , age undetermined Abnormal ECG When compared with ECG of 11-Mar-2020 12:06, No significant change was found Confirmed by Gareth Morgan [phone]) on 06/25/2022 7:16:55 PM  Radiology DG Chest 2 View  Result Date: 06/25/2022 CLINICAL DATA:  Chest pain, arm pain EXAM: CHEST - 2 VIEW COMPARISON:   03/11/2020 FINDINGS: The heart size and mediastinal contours are within normal limits. Both lungs are clear. The visualized skeletal structures are unremarkable. IMPRESSION: No active cardiopulmonary disease. Electronically Signed   By: Rolm Baptise M.D.   On: 06/25/2022 19:09    Procedures Procedures  {Document cardiac monitor, telemetry assessment procedure when appropriate:1}  Medications Ordered in ED Medications - No data to display  ED Course/ Medical Decision Making/ A&P   {   Click here for ABCD2, HEART and other calculatorsREFRESH Note before signing :1}                          Medical Decision Making Amount and/or Complexity of Data Reviewed Labs: ordered. Radiology: ordered.   ***  {Document critical care time when appropriate:1} {Document review of labs and clinical decision tools ie heart score, Chads2Vasc2 etc:1}  {Document your independent review of radiology images, and any outside records:1} {Document your discussion with family members, caretakers, and with consultants:1} {Document social determinants of health affecting pt's care:1} {Document your decision making why or why not admission, treatments were needed:1} Final Clinical Impression(s) / ED Diagnoses Final diagnoses:  None    Rx / DC Orders ED Discharge Orders     None

## 2022-06-25 NOTE — ED Notes (Signed)
Patient reports being sent from her PCP for an abnormal EKG. Patient went to the PCP after having right arm pain. PCP thought it was related to bursitis. Patient states the EKG the performed in the office was different than the one they performed in 2021. Patient sees a cardiologist and has not had any concerns over abnormal EKGs. At this time the patient denies chest pain, shortness of breath, N/V, and right arm pain.

## 2022-06-25 NOTE — ED Triage Notes (Signed)
Pt sent from pmd for pain in right arm, took EKG in office, told EKG change from last one in 2021.  Pt sees cardiologist , has appointment at the end of month.  Hx. BBB.  HTN, COPD,

## 2022-07-01 ENCOUNTER — Telehealth: Payer: Self-pay | Admitting: Internal Medicine

## 2022-07-01 NOTE — Telephone Encounter (Signed)
Patient would like to switch from Dr. Harrington Challenger to Dr. Johney Frame.  Are you both in agreement with this?

## 2022-07-12 ENCOUNTER — Ambulatory Visit: Payer: Medicare Other | Admitting: Internal Medicine

## 2022-07-28 ENCOUNTER — Ambulatory Visit: Payer: Medicare HMO | Attending: Cardiology | Admitting: Cardiology

## 2022-07-28 ENCOUNTER — Encounter: Payer: Self-pay | Admitting: Cardiology

## 2022-07-28 VITALS — BP 150/85 | HR 74 | Ht 64.0 in | Wt 158.0 lb

## 2022-07-28 DIAGNOSIS — R0609 Other forms of dyspnea: Secondary | ICD-10-CM | POA: Diagnosis not present

## 2022-07-28 DIAGNOSIS — I10 Essential (primary) hypertension: Secondary | ICD-10-CM | POA: Diagnosis not present

## 2022-07-28 DIAGNOSIS — Z8249 Family history of ischemic heart disease and other diseases of the circulatory system: Secondary | ICD-10-CM | POA: Diagnosis not present

## 2022-07-28 NOTE — Progress Notes (Signed)
Cardiology Office Note:    Date:  07/28/2022   ID:  Ziana Nembhard, DOB Feb 22, 1946, MRN [id number]  PCP:  Shanon Ace, [place] [facility] Cardiologist:  Dorris Carnes, MD   Referring MD: Gareth Morgan, MD    History of Present Illness:    Kristy Jacobs is a 77 y.o. female with a hx of HTN, orthostatic syncope and arthritis who was previously followed by Dr. Harrington Challenger who now presents to clinic for follow-up.  Per review of the record, TTE in 2020 showed LVEF 60-65%, mild MAC, trace MR, trivial MR. CPET in 2021 with normal functional capacity. Cardiac monitor 03/2020 with occasional PVC, PAC.   Today, the patient overall feels well. Has had some right arm/shoulder pain that is sharp and intermittent. Pain is not exertional in nature and can come on at rest. Symptoms can improve with intermittent ibuprofen. She otherwise feels well with activity. No chest pain, orthopnea or PND. Has shortness of breath with exertion due to underlying COPD.   Blood pressure is very well controlled at home. Always elevated in MD office  Past Medical History:  Diagnosis Date   Allergy    Arthritis    Hypertension     Past Surgical History:  Procedure Laterality Date   NO PAST SURGERIES      Current Medications: Current Meds  Medication Sig   amLODipine (NORVASC) 2.5 MG tablet TAKE 1 TABLET BY MOUTH DAILY   fluticasone (FLONASE) 50 MCG/ACT nasal spray Place into both nostrils daily.   fluticasone furoate-vilanterol (BREO ELLIPTA) 100-25 MCG/ACT AEPB Inhale 1 puff into the lungs daily.   ipratropium (ATROVENT) 0.03 % nasal spray as needed.   Magnesium 500 MG CAPS Take 200 mg by mouth every other day.   montelukast (SINGULAIR) 10 MG tablet TAKE 1 TABLET BY MOUTH AT  BEDTIME   nystatin (MYCOSTATIN/NYSTOP) powder as needed.   Probiotic Product (PROBIOTIC PO) Take 1 tablet by mouth daily.   propranolol (INDERAL) 10 MG tablet Take 10 mg by mouth as needed.   valsartan (DIOVAN)  320 MG tablet TAKE 1 TABLET BY MOUTH DAILY     Allergies:   Ace inhibitors, Compazine [prochlorperazine edisylate], and Hctz [hydrochlorothiazide]   Social History   Socioeconomic History   Marital status: Married    Spouse name: Not on file   Number of children: Not on file   Years of education: Not on file   Highest education level: Not on file  Occupational History   Not on file  Tobacco Use   Smoking status: Never   Smokeless tobacco: Never  Substance and Sexual Activity   Alcohol use: Yes    Alcohol/week: 2.0 standard drinks of alcohol    Types: 2 Glasses of wine per week   Drug use: No   Sexual activity: Yes  Other Topics Concern   Not on file  Social History Narrative   Not on file   Social Determinants of Health   Financial Resource Strain: Not on file  Food Insecurity: Not on file  Transportation Needs: Not on file  Physical Activity: Not on file  Stress: Not on file  Social Connections: Not on file     Family History: The patient's family history includes Cancer in her father; Hypertension in her brother, father, mother, and sister; Stroke in her father.  ROS:   Please see the history of present illness.     All other systems reviewed and are negative.  EKGs/Labs/Other Studies Reviewed:  The following studies were reviewed today: TTE 03/15/19: IMPRESSIONS     1. Left ventricular ejection fraction, by visual estimation, is 60 to  65%. The left ventricle has normal function. Normal left ventricular size.  There is no left ventricular hypertrophy.   2. Left ventricular diastolic Doppler parameters are consistent with  impaired relaxation pattern of LV diastolic filling.   3. Global right ventricle has normal systolic function.The right  ventricular size is normal.   4. Left atrial size was normal.   5. Right atrial size was normal.   6. Mild mitral annular calcification.   7. The mitral valve is normal in structure. Trace mitral valve   regurgitation. No evidence of mitral stenosis.   8. The tricuspid valve is normal in structure. Tricuspid valve  regurgitation is trivial.   9. The aortic valve is normal in structure. Aortic valve regurgitation  was not visualized by color flow Doppler. Structurally normal aortic  valve, with no evidence of sclerosis or stenosis.  10. The pulmonic valve was normal in structure. Pulmonic valve  regurgitation is not visualized by color flow Doppler.  11. Normal pulmonary artery systolic pressure.  12. The inferior vena cava is normal in size with greater than 50%  respiratory variability, suggesting right atrial pressure of 3 mmHg.  13. Normal LV systolic function; grade 1 diastolic dysfunction.   Cardiac Monitor 03/2020: Sinus rhythm   51 to 126 bpm   Average HR 80 bpm Occasional PVC, PAC    EKG:  EKG is  ordered today.  The ekg ordered today demonstrates NSR, first degree AVB, RBBB, HR 74bpm  Recent Labs: 06/25/2022: BUN 16; Creatinine, Ser 0.87; Hemoglobin 11.7; Platelets 421; Potassium 3.8; Sodium 136  Recent Lipid Panel    Component Value Date/Time   CHOL 186 07/22/2015 0954   TRIG 81 07/22/2015 0954   HDL 67 07/22/2015 0954   CHOLHDL 2.8 07/22/2015 0954   VLDL 16 07/22/2015 0954   LDLCALC 103 07/22/2015 0954     Risk Assessment/Calculations:            Physical Exam:    VS:  BP (!) 150/85   Pulse 74   Ht 5' 4"$  (1.626 m)   Wt 158 lb (71.7 kg)   SpO2 97%   BMI 27.12 kg/m     Wt Readings from Last 3 Encounters:  07/28/22 158 lb (71.7 kg)  06/25/22 155 lb (70.3 kg)  04/14/22 155 lb (70.3 kg)     GEN:  Well nourished, well developed in no acute distress HEENT: Normal NECK: No JVD; No carotid bruits CARDIAC: RRR, no murmurs, rubs, gallops RESPIRATORY:  Clear to auscultation without rales, wheezing or rhonchi  ABDOMEN: Soft, non-tender, non-distended MUSCULOSKELETAL:  No edema; No deformity  SKIN: Warm and dry NEUROLOGIC:  Alert and oriented x  3 PSYCHIATRIC:  Normal affect   ASSESSMENT:    1. Primary hypertension   2. Family history of early CAD   3. Dyspnea on exertion    PLAN:    In order of problems listed above:  #HTN: Very well controlled at home.  -Continue amlodipine 2.37m daily -Continue valsartan 3280mdaily  #Dyspnea on Exertion: Reassuring CV work-up with normal CPET. TTE with normal BiV function and no significant valve disease.  #CV Risk Stratification: -Check Ca score -Check lipids            Medication Adjustments/Labs and Tests Ordered: Current medicines are reviewed at length with the patient today.  Concerns regarding medicines are  outlined above.  Orders Placed This Encounter  Procedures   CT CARDIAC SCORING (SELF PAY ONLY)   Lipid Profile   EKG 12-Lead   No orders of the defined types were placed in this encounter.   Patient Instructions  Medication Instructions:   Your physician recommends that you continue on your current medications as directed. Please refer to the Current Medication list given to you today.  *If you need a refill on your cardiac medications before your next appointment, please call your pharmacy*   Lab Work:  SOMETIME THIS WEEK OR NEXT WEEK HERE IN THE OFFICE--LIPIDS--PLEASE COME FASTING  If you have labs (blood work) drawn today and your tests are completely normal, you will receive your results only by: [place] (if you have MyChart) OR A paper copy in the mail If you have any lab test that is abnormal or we need to change your treatment, we will call you to review the results.   Testing/Procedures:  CARDIAC CALCIUM SCORE (SELF PAY)    Follow-Up: At [facility], you and your health needs are our priority.  As part of our continuing mission to provide you with exceptional heart care, we have created designated Provider Care Teams.  These Care Teams include your primary Cardiologist (physician) and Advanced Practice Providers (APPs -   Physician Assistants and Nurse Practitioners) who all work together to provide you with the care you need, when you need it.  We recommend signing up for the patient portal called "MyChart".  Sign up information is provided on this After Visit Summary.  MyChart is used to connect with patients for Virtual Visits (Telemedicine).  Patients are able to view lab/test results, encounter notes, upcoming appointments, etc.  Non-urgent messages can be sent to your provider as well.   To learn more about what you can do with MyChart, go to [email or web address].    Your next appointment:   1 year(s)  Provider:   DR. Johney Frame    Signed, Freada Bergeron, MD  07/28/2022 4:23 PM    [place]

## 2022-07-28 NOTE — Patient Instructions (Signed)
Medication Instructions:   Your physician recommends that you continue on your current medications as directed. Please refer to the Current Medication list given to you today.  *If you need a refill on your cardiac medications before your next appointment, please call your pharmacy*   Lab Work:  SOMETIME THIS WEEK OR NEXT WEEK HERE IN THE OFFICE--LIPIDS--PLEASE COME FASTING  If you have labs (blood work) drawn today and your tests are completely normal, you will receive your results only by: [place] (if you have MyChart) OR A paper copy in the mail If you have any lab test that is abnormal or we need to change your treatment, we will call you to review the results.   Testing/Procedures:  CARDIAC CALCIUM SCORE (SELF PAY)    Follow-Up: At [facility], you and your health needs are our priority.  As part of our continuing mission to provide you with exceptional heart care, we have created designated Provider Care Teams.  These Care Teams include your primary Cardiologist (physician) and Advanced Practice Providers (APPs -  Physician Assistants and Nurse Practitioners) who all work together to provide you with the care you need, when you need it.  We recommend signing up for the patient portal called "MyChart".  Sign up information is provided on this After Visit Summary.  MyChart is used to connect with patients for Virtual Visits (Telemedicine).  Patients are able to view lab/test results, encounter notes, upcoming appointments, etc.  Non-urgent messages can be sent to your provider as well.   To learn more about what you can do with MyChart, go to [email or web address].    Your next appointment:   1 year(s)  Provider:   DR. Johney Frame

## 2022-08-05 ENCOUNTER — Ambulatory Visit: Payer: Medicare HMO | Attending: Cardiology

## 2022-08-05 DIAGNOSIS — Z8249 Family history of ischemic heart disease and other diseases of the circulatory system: Secondary | ICD-10-CM

## 2022-08-05 LAB — LIPID PANEL
Chol/HDL Ratio: 2.5 ratio (ref 0.0–4.4)
Cholesterol, Total: 199 mg/dL (ref 100–199)
HDL: 79 mg/dL (ref >39)
LDL Chol Calc (NIH): 105 mg/dL — ABNORMAL HIGH (ref 0–99)
Triglycerides: 86 mg/dL (ref 0–149)
VLDL Cholesterol Cal: 15 mg/dL (ref 5–40)

## 2022-08-06 ENCOUNTER — Ambulatory Visit (HOSPITAL_BASED_OUTPATIENT_CLINIC_OR_DEPARTMENT_OTHER)
Admission: RE | Admit: 2022-08-06 | Discharge: 2022-08-06 | Disposition: A | Discharge status: Home / Self Care | Payer: Medicare HMO | Source: Ambulatory Visit | Attending: Cardiology | Admitting: Cardiology

## 2022-08-06 DIAGNOSIS — Z8249 Family history of ischemic heart disease and other diseases of the circulatory system: Secondary | ICD-10-CM | POA: Insufficient documentation

## 2022-08-11 ENCOUNTER — Telehealth: Payer: Self-pay | Admitting: *Deleted

## 2022-08-11 DIAGNOSIS — R918 Other nonspecific abnormal finding of lung field: Secondary | ICD-10-CM

## 2022-08-11 DIAGNOSIS — R9389 Abnormal findings on diagnostic imaging of other specified body structures: Secondary | ICD-10-CM

## 2022-08-11 NOTE — Telephone Encounter (Signed)
-----   Message from Freada Bergeron, MD sent at 08/11/2022  5:16 PM EST ----- The CT scan was over read by radiology and she was noted to have multiple nodules and they recommend a repeat CT scan in 3-6 months to reassess. Often they stay stable or decrease in size and we can space out the monitoring.

## 2022-08-11 NOTE — Telephone Encounter (Signed)
The patient has been notified of the result and verbalized understanding.  All questions (if any) were answered.  Pt aware of over-read report by Radiologist from her Cardiac calcium CT scan.  She is aware that we will order for her to get a non-contrast Chest CT done in 3-6 months to reassess noted multiple lung nodules noted on this report.    She is aware that I will send a message to our [facility] Scheduling team to call her back to arrange her non-contrast Chest CT to be done in 3-6 months.   Pt verbalized understanding and agrees with this plan.

## 2022-08-23 ENCOUNTER — Ambulatory Visit: Payer: Self-pay | Admitting: Cardiology

## 2022-09-05 ENCOUNTER — Encounter: Payer: Self-pay | Admitting: Pulmonary Disease

## 2022-09-05 ENCOUNTER — Encounter: Payer: Self-pay | Admitting: Cardiology

## 2022-09-06 MED ORDER — VALSARTAN 320 MG PO TABS: ORAL_TABLET | ORAL | 4 refills | Status: DC

## 2022-09-06 MED ORDER — AMLODIPINE BESYLATE 2.5 MG PO TABS: 2.5000 mg | ORAL_TABLET | Freq: Every day | ORAL | 3 refills | Status: DC

## 2022-09-06 MED ORDER — MONTELUKAST SODIUM 10 MG PO TABS: 10.0000 mg | ORAL_TABLET | Freq: Every day | ORAL | 2 refills | Status: DC

## 2022-10-19 ENCOUNTER — Other Ambulatory Visit: Payer: Self-pay | Admitting: Physical Medicine and Rehabilitation

## 2022-10-19 ENCOUNTER — Telehealth: Payer: Self-pay | Admitting: Physical Medicine and Rehabilitation

## 2022-10-19 DIAGNOSIS — M5416 Radiculopathy, lumbar region: Secondary | ICD-10-CM

## 2022-10-19 NOTE — Telephone Encounter (Signed)
Patient called needing to schedule an appointment for an injection in her back. The number to contact patient is [phone]

## 2022-10-19 NOTE — Progress Notes (Signed)
Per patient, greater than 75% relief of pain with previous right L5-S1 interlaminar epidural steroid injection on 05/27/2022. Report 3 month or greater pain relief post injection. I will place order for repeat injection.

## 2022-10-19 NOTE — Telephone Encounter (Signed)
Spoke with patient and she is requesting a repeat injection with the same pain. Last injection was 05/2022 and lasted about 3.5 months with at least 75% relief. No new falls, accidents or injuries. Please advise

## 2022-10-21 ENCOUNTER — Telehealth: Payer: Self-pay | Admitting: *Deleted

## 2022-10-21 ENCOUNTER — Telehealth (HOSPITAL_BASED_OUTPATIENT_CLINIC_OR_DEPARTMENT_OTHER): Payer: Self-pay

## 2022-10-21 ENCOUNTER — Encounter: Payer: Self-pay | Admitting: Cardiology

## 2022-10-21 NOTE — Telephone Encounter (Signed)
----- Message from Francine Graven sent at 10/21/2022  2:12 PM EDT ----- Regarding: RE: PEER TO PEER REQUEST Auth has been approved. I've updated notes and referral. ----- Message ----- From: Loa Socks, LPN Sent: 06/19/1094   1:34 PM EDT To: Claudean Severance Abdul-Razzaaq Subject: FW: PEER TO PEER REQUEST                        ----- Message ----- From: Meriam Sprague, MD Sent: 10/21/2022   1:32 PM EDT To: Loa Socks, LPN Subject: RE: PEER TO PEER REQUEST                       Happy to discuss as the indication is for pulmonary nodules. Let me know if I need to call. ----- Message ----- From: Loa Socks, LPN Sent: 0/09/5407  11:12 AM EDT To: Meriam Sprague, MD Subject: Annell Greening: PEER TO PEER REQUEST                        ----- Message ----- From: Francine Graven Sent: 10/21/2022   8:36 AM EDT To: Loa Socks, LPN Subject: RE: PEER TO PEER REQUEST                       Thank you , I've faxed over the results of Calcium score. If I don't hear anything by Noon, I will have pt rescheduled. ----- Message ----- From: Loa Socks, LPN Sent: 01/12/1913   4:23 PM EDT To: Claudean Severance Abdul-Razzaaq; # Subject: FW: PEER TO PEER REQUEST                       The CT OF CHEST was ordered for noted multiple pulmonary nodules seen on recent Calcium Score.  Radiologist read this and recommended this test to be done which is why Dr. Shari Prows ordered.  We have already done the Ca Score, so unclear why this is being denied for pulmonary nodules.  Dr. Shari Prows do you have any other suggestions, or are you willing to call this number provided to do the peer-to-peer discussion with the pts carrier.  Her CT OF CHEST is scheduled for this Friday 5/10. Please advise!  Thanks Lajoyce Corners  ----- Message ----- From: Francine Graven Sent: 10/20/2022   3:50 PM EDT To: Loa Socks, LPN; Meriam Sprague, MD Subject: PEER TO PEER REQUEST                           Good afternoon,    Monia Pouch is requesting a peer to peer discussion to approve auth. I've faxed the last OV & EKG results with submission of auth. Following are the details.  This notice is not a denial of coverage notice. This notice is an opportunity to engage in a Peer-to-Peer discussion, prior to issuing a denial decision.  This information contains the reason why the request does not currently  meet medical necessity, and the information needed to support an approval: For 78295 CT THORAX W/O CONTRAST Your doctor told us that there may be need  to screen you for disease in your heart. An imaging study was asked for. We cannot  approve this request because: You must not have a family history of premature coronary artery disease (a disease to the  blood vessels that provide blood  to your heart muscle). The type of picture study requested is not supported for your reported condition. The type  of picture study(ies) supported is listed in the guideline.  This finding was based on review of Medicare National Coverage Determinations  (NCD): 220.1 Computed Tomography and eviCore Cardiac Imaging Guidelines  Section(s): CT for Coronary Calcium Scoring (CD-4.2) and Preface to the Imaging  Guidelines, section Preface-3.1 Clinical Information.  Please call [phone],  select the prompt associated with the request type and enter in reference number [id number] to  speak with a clinician about this request. You may fax additional clinical information to support  this request to [phone] for review.  Thanks, [employer]

## 2022-10-21 NOTE — Telephone Encounter (Signed)
Called the pt about mychart sent.  She is aware that our billing dept representative faxed her insurance company her recent calcium score results and the indication from this result as to why she needed to have a CHEST CT WO CONTRAST done, to further evaluate incidental findings of multiple pulmonary nodules.    Informed the pt that per Anisah with billing, she faxed over the results of Ca Score to her insurance for approval of chest ct, and they need to review this proof, so that she can get her CT rescheduled hopefully for tomorrow or for early next week.   Pt said that [facility] -CT dept called her this afternoon and informed her that they cancelled her CT for tomorrow due to insurance denial.    Advised the pt if she could to call her insurance carrier tonight or first thing in the morning and endorse to them that our office did fax over proof and results of her recent Ca Score (where Chest CT was indicated), so that they can immediately approve for her CHEST CT to be done, so we can get this r/s.   She is aware that I will have Anisah with our billing dept call her first thing in the morning, to further assist with this and to get her CT rescheduled.   Pt verbalized understanding and agrees with this plan.

## 2022-10-21 NOTE — Telephone Encounter (Signed)
FW: PEER TO PEER REQUEST Received: Today Loa Socks, LPN  Meriam Sprague, MD      Previous Messages    ----- Message ----- From: Francine Graven Sent: 10/21/2022   8:36 AM EDT To: Loa Socks, LPN Subject: RE: PEER TO PEER REQUEST                      Thank you , I've faxed over the results of Calcium score. If I don't hear anything by Noon, I will have pt rescheduled. ----- Message ----- From: Loa Socks, LPN Sent: 06/17/863   4:23 PM EDT To: Claudean Severance Abdul-Razzaaq; * Subject: FW: PEER TO PEER REQUEST                      The CT OF CHEST was ordered for noted multiple pulmonary nodules seen on recent Calcium Score.  Radiologist read this and recommended this test to be done which is why Dr. Shari Prows ordered.  We have already done the Ca Score, so unclear why this is being denied for pulmonary nodules.  Dr. Shari Prows do you have any other suggestions, or are you willing to call this number provided to do the peer-to-peer discussion with the pts carrier.  Her CT OF CHEST is scheduled for this Friday 5/10. Please advise!  Thanks Lajoyce Corners ----- Message ----- From: Francine Graven Sent: 10/20/2022   3:50 PM EDT To: Loa Socks, LPN; Meriam Sprague, MD Subject: PEER TO PEER REQUEST                          Good afternoon,  Monia Pouch is requesting a peer to peer discussion to approve auth. I've faxed the last OV & EKG results with submission of auth. Following are the details.  This notice is not a denial of coverage notice. This notice is an opportunity to engage in a Peer-to-Peer discussion, prior to issuing a denial decision.  This information contains the reason why the request does not currently meet medical necessity, and the information needed to support an approval: For 78469 CT THORAX W/O CONTRAST Your doctor told us that there may be need to screen you for disease in your heart. An imaging study was asked for. We cannot approve this request  because: You must not have a family history of premature coronary artery disease (a disease to the blood vessels that provide blood to your heart muscle). The type of picture study requested is not supported for your reported condition. The type of picture study(ies) supported is listed in the guideline. This finding was based on review of Medicare National Coverage Determinations (NCD): 220.1 Computed Tomography and eviCore Cardiac Imaging Guidelines Section(s): CT for Coronary Calcium Scoring (CD-4.2) and Preface to the Imaging Guidelines, section Preface-3.1 Clinical Information.  Please call [phone], select the prompt associated with the request type and enter in reference number [id number] to speak with a clinician about this request. You may fax additional clinical information to support this request to [phone] for review.  Thanks, [employer]

## 2022-10-22 ENCOUNTER — Ambulatory Visit (HOSPITAL_BASED_OUTPATIENT_CLINIC_OR_DEPARTMENT_OTHER): Payer: Medicare HMO

## 2022-10-22 ENCOUNTER — Encounter (HOSPITAL_BASED_OUTPATIENT_CLINIC_OR_DEPARTMENT_OTHER): Payer: Self-pay

## 2022-10-25 ENCOUNTER — Ambulatory Visit (HOSPITAL_BASED_OUTPATIENT_CLINIC_OR_DEPARTMENT_OTHER)
Admission: RE | Admit: 2022-10-25 | Discharge: 2022-10-25 | Disposition: A | Discharge status: Home / Self Care | Payer: Medicare HMO | Source: Ambulatory Visit | Attending: Cardiology | Admitting: Cardiology

## 2022-10-25 DIAGNOSIS — R9389 Abnormal findings on diagnostic imaging of other specified body structures: Secondary | ICD-10-CM | POA: Diagnosis present

## 2022-10-25 DIAGNOSIS — R918 Other nonspecific abnormal finding of lung field: Secondary | ICD-10-CM | POA: Diagnosis present

## 2022-10-28 ENCOUNTER — Encounter: Payer: Self-pay | Admitting: Pulmonary Disease

## 2022-10-28 ENCOUNTER — Ambulatory Visit: Payer: Medicare HMO | Admitting: Pulmonary Disease

## 2022-10-28 VITALS — BP 122/80 | HR 64 | Ht 64.0 in | Wt 156.8 lb

## 2022-10-28 DIAGNOSIS — J449 Chronic obstructive pulmonary disease, unspecified: Secondary | ICD-10-CM

## 2022-10-28 DIAGNOSIS — R9389 Abnormal findings on diagnostic imaging of other specified body structures: Secondary | ICD-10-CM | POA: Diagnosis not present

## 2022-10-28 NOTE — Progress Notes (Signed)
Kristy Jacobs    [id number]    03/12/1946  Primary Care Physician:Clark, Jennye Moccasin, MD  Referring Physician: Melvenia Beam, MD [address]  [address] [place],  [place] [address]  Chief complaint: Being seen for shortness of breath Breathing remains about the same  HPI:  Breathing feels about the same  Continues with Virgel Bouquet  Feels her breathing remains stable Not any worse  She recently had a cardiac CT showing lung nodules Had a repeat CT about 2 months afterwards by my evaluation the CT looks about the same there are couple of other nodules in the upper part of the lung that was not captured on recent CT  She has no underlying neoplastic process Colonoscopies recently were clean, does mammograms, no gynecological bleeding   She remains compliant with inhalers  She does use Atrovent nasal and Flonase for nasal stuffiness  Rarely needs albuterol  PFT with severe obstructive disease  Abnormal hypertensive response to exercise  She does have iron deficiency  Never smoker Some exposure to secondhand smoke  No occupational predisposition to lung disease  Outpatient Encounter Medications as of 10/28/2022  Medication Sig   amLODipine (NORVASC) 2.5 MG tablet Take 1 tablet (2.5 mg total) by mouth daily.   fluticasone (FLONASE) 50 MCG/ACT nasal spray Place into both nostrils daily.   fluticasone furoate-vilanterol (BREO ELLIPTA) 100-25 MCG/ACT AEPB Inhale 1 puff into the lungs daily.   ipratropium (ATROVENT) 0.03 % nasal spray as needed.   Magnesium 500 MG CAPS Take 200 mg by mouth every other day.   montelukast (SINGULAIR) 10 MG tablet Take 1 tablet (10 mg total) by mouth at bedtime.   nystatin (MYCOSTATIN/NYSTOP) powder as needed.   Probiotic Product (PROBIOTIC PO) Take 1 tablet by mouth daily.   propranolol (INDERAL) 10 MG tablet Take 10 mg by mouth as needed.   valsartan (DIOVAN) 320 MG tablet TAKE 1 TABLET BY MOUTH DAILY   No  facility-administered encounter medications on file as of 10/28/2022.    Allergies as of 10/28/2022 - Review Complete 10/28/2022  Allergen Reaction Noted   Ace inhibitors Cough 02/28/2020   Compazine [prochlorperazine edisylate] Nausea And Vomiting 01/28/2014   Hctz [hydrochlorothiazide]  04/17/2020    Past Medical History:  Diagnosis Date   Allergy    Arthritis    Hypertension     Past Surgical History:  Procedure Laterality Date   NO PAST SURGERIES      Family History  Problem Relation Age of Onset   Hypertension Mother    Hypertension Father    Cancer Father    Stroke Father    Hypertension Brother    Hypertension Sister     Social History   Socioeconomic History   Marital status: Married    Spouse name: Not on file   Number of children: Not on file   Years of education: Not on file   Highest education level: Not on file  Occupational History   Not on file  Tobacco Use   Smoking status: Never   Smokeless tobacco: Never  Substance and Sexual Activity   Alcohol use: Yes    Alcohol/week: 2.0 standard drinks of alcohol    Types: 2 Glasses of wine per week   Drug use: No   Sexual activity: Yes  Other Topics Concern   Not on file  Social History Narrative   Not on file   Social Determinants of Health   Financial Resource Strain: Not  on file  Food Insecurity: Not on file  Transportation Needs: Not on file  Physical Activity: Not on file  Stress: Not on file  Social Connections: Not on file  Intimate Partner Violence: Not on file    Review of Systems  Respiratory:  Positive for shortness of breath. Negative for cough.   Cardiovascular:  Negative for chest pain.  All other systems reviewed and are negative.   Vitals:   10/28/22 0938  BP: 122/80  Pulse: 64  SpO2: 100%      Physical Exam Constitutional:      Appearance: Normal appearance.  HENT:     Head: Normocephalic.     Nose: No congestion.     Mouth/Throat:     Mouth: Mucous  membranes are moist.     Pharynx: No posterior oropharyngeal erythema.  Eyes:     General:        Right eye: No discharge.        Left eye: No discharge.  Cardiovascular:     Rate and Rhythm: Normal rate and regular rhythm.     Heart sounds: No murmur heard.    No friction rub.  Pulmonary:     Effort: No respiratory distress.     Breath sounds: No stridor. No wheezing.  Musculoskeletal:     Cervical back: No rigidity or tenderness.  Neurological:     Mental Status: She is alert.  Psychiatric:        Mood and Affect: Mood normal.   Data Reviewed: Chest x-ray 03/11/2020-no infiltrative process noted on chest x-ray reviewed with the patient  Exercise stress study reviewed  PFT with severe obstructive disease with no significant bronchodilator response  CT scan was reviewed reviewed in the office today with the patient, the 2 CTs that were available were reviewed and compared Multiple lung nodules noted  Assessment:   Multiple lung nodules -Present on both CT scan from February and one performed 3 days ago  Shortness of breath with activity -This has remained stable -Continues to use Breo  Chronic obstructive pulmonary disease -Obstructive defect on PFT  She does not feel any worse with her breathing -Will stay on the Breo at present   Plan/Recommendations: Continue current inhalers  Graded activities as tolerated  We will await formal reading of the CT scan and if nodules are stable will likely need repeat in about 3 months  Follow-up in 3 months  Encouraged to call with significant concerns    Virl Diamond MD [facility] 10/28/2022, 9:56 AM  CC: Melvenia Beam,*

## 2022-10-28 NOTE — Patient Instructions (Signed)
I will see you back in 3 months  We will wait and see what the radiologist reads the CAT scan as From my review that we looked at together in the office today, I do not see any significant changes  Neck step would likely be that we repeat the CAT scan in a few months to make sure that the spots remained stable  Continue to pay attention to symptoms  Continue using your current inhaler  Continues to stay active  If worsening shortness of breath, we can make changes to your inhalers

## 2022-11-01 ENCOUNTER — Telehealth: Payer: Self-pay | Admitting: Cardiology

## 2022-11-01 NOTE — Telephone Encounter (Signed)
Spoke with Kristy Jacobs and advised of results per Dr Shari Prows as below.  Kristy Jacobs verbalizes understanding and states she has follow up with Pulmonary in 3 months.  Kristy Jacobs thanked Charity fundraiser for the call.    Meriam Sprague, MD 10/29/2022  7:02 PM EDT     Nodules are stable in size. Recommend to repeat CT chest in 3 months for monitoring. Looks like she follows with Pulm so would just make sure that they are aware too!

## 2022-11-01 NOTE — Telephone Encounter (Signed)
Patient returned RN's call. 

## 2022-11-03 ENCOUNTER — Telehealth: Payer: Self-pay | Admitting: Physical Medicine and Rehabilitation

## 2022-11-03 ENCOUNTER — Encounter: Payer: Self-pay | Admitting: Physical Medicine and Rehabilitation

## 2022-11-03 NOTE — Telephone Encounter (Signed)
Pt called to cancel appt. Please call pt to confirm appt has been cancelled. Pt phone number is [phone]

## 2022-11-04 ENCOUNTER — Encounter: Payer: Medicare HMO | Admitting: Physical Medicine and Rehabilitation

## 2022-11-04 NOTE — Telephone Encounter (Signed)
Appointment rescheduled for 11/11/22

## 2022-11-11 ENCOUNTER — Other Ambulatory Visit: Payer: Self-pay

## 2022-11-11 ENCOUNTER — Ambulatory Visit: Payer: Medicare HMO | Admitting: Physical Medicine and Rehabilitation

## 2022-11-11 VITALS — BP 137/82 | HR 64

## 2022-11-11 DIAGNOSIS — M48062 Spinal stenosis, lumbar region with neurogenic claudication: Secondary | ICD-10-CM

## 2022-11-11 DIAGNOSIS — M5416 Radiculopathy, lumbar region: Secondary | ICD-10-CM | POA: Diagnosis not present

## 2022-11-11 MED ORDER — METHYLPREDNISOLONE ACETATE 80 MG/ML IJ SUSP
80.0000 mg | Freq: Once | INTRAMUSCULAR | Status: AC
  Administered 2022-11-11: 80 mg

## 2022-11-11 NOTE — Patient Instructions (Signed)

## 2022-11-11 NOTE — Procedures (Signed)
Lumbar Epidural Steroid Injection - Interlaminar Approach with Fluoroscopic Guidance  Patient: Kristy Jacobs      Date of Birth: 12-Apr-1946 MRN: [id number] PCP: Melvenia Beam, MD      Visit Date: 11/11/2022   Universal Protocol:     Consent Given By: the patient  Position: PRONE  Additional Comments: Vital signs were monitored before and after the procedure. Patient was prepped and draped in the usual sterile fashion. The correct patient, procedure, and site was verified.   Injection Procedure Details:   Procedure diagnoses: Lumbar radiculopathy [M54.16]   Meds Administered:  Meds ordered this encounter  Medications   methylPREDNISolone acetate (DEPO-MEDROL) injection 80 mg     Laterality: Right  Location/Site:  L5-S1  Needle: 3.5 in., 20 ga. Tuohy  Needle Placement: Paramedian epidural  Findings:   -Comments: Excellent flow of contrast into the epidural space.  Procedure Details: Using a paramedian approach from the side mentioned above, the region overlying the inferior lamina was localized under fluoroscopic visualization and the soft tissues overlying this structure were infiltrated with 4 ml. of 1% Lidocaine without Epinephrine. The Tuohy needle was inserted into the epidural space using a paramedian approach.   The epidural space was localized using loss of resistance along with counter oblique bi-planar fluoroscopic views.  After negative aspirate for air, blood, and CSF, a 2 ml. volume of Isovue-250 was injected into the epidural space and the flow of contrast was observed. Radiographs were obtained for documentation purposes.    The injectate was administered into the level noted above.   Additional Comments:  No complications occurred Dressing: 2 x 2 sterile gauze and Band-Aid    Post-procedure details: Patient was observed during the procedure. Post-procedure instructions were reviewed.  Patient left the clinic in stable condition.

## 2022-11-11 NOTE — Progress Notes (Signed)
Kristy Jacobs - 77 y.o. female MRN [id number]  Date of birth: 06-Dec-1945  Office Visit Note: Visit Date: 11/11/2022 PCP: Melvenia Beam, MD Referred by: Melvenia Beam,*  Subjective: Chief Complaint  Patient presents with   Lower Back - Pain   HPI:  Kristy Jacobs is a 77 y.o. female who comes in today for planned repeat Right L5-S1  Lumbar Interlaminar epidural steroid injection with fluoroscopic guidance.  The patient has failed conservative care including home exercise, medications, time and activity modification.  This injection will be diagnostic and hopefully therapeutic.  Please see requesting physician notes for further details and justification. Patient received more than 50% pain relief from prior injection.   Referring: Dr. Marrianne Mood Dean   ROS Otherwise per HPI.  Assessment & Plan: Visit Diagnoses:    ICD-10-CM   1. Lumbar radiculopathy  M54.16 XR C-ARM NO REPORT    Epidural Steroid injection    methylPREDNISolone acetate (DEPO-MEDROL) injection 80 mg    2. 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   Meds & Orders:  Meds ordered this encounter  Medications   methylPREDNISolone acetate (DEPO-MEDROL) injection 80 mg    Orders Placed This Encounter  Procedures   XR C-ARM NO REPORT   Epidural Steroid injection    Follow-up: Return for visit to requesting provider as needed.   Procedures: No procedures performed  Lumbar Epidural Steroid Injection - Interlaminar Approach with Fluoroscopic Guidance  Patient: Kristy Jacobs      Date of Birth: 20-Jul-1945 MRN: [id number] PCP: Melvenia Beam, MD      Visit Date: 11/11/2022   Universal Protocol:     Consent Given By: the patient  Position: PRONE  Additional Comments: Vital signs were monitored before and after the procedure. Patient was prepped and draped in the usual sterile fashion. The correct patient, procedure, and  site was verified.   Injection Procedure Details:   Procedure diagnoses: Lumbar radiculopathy [M54.16]   Meds Administered:  Meds ordered this encounter  Medications   methylPREDNISolone acetate (DEPO-MEDROL) injection 80 mg     Laterality: Right  Location/Site:  L5-S1  Needle: 3.5 in., 20 ga. Tuohy  Needle Placement: Paramedian epidural  Findings:   -Comments: Excellent flow of contrast into the epidural space.  Procedure Details: Using a paramedian approach from the side mentioned above, the region overlying the inferior lamina was localized under fluoroscopic visualization and the soft tissues overlying this structure were infiltrated with 4 ml. of 1% Lidocaine without Epinephrine. The Tuohy needle was inserted into the epidural space using a paramedian approach.   The epidural space was localized using loss of resistance along with counter oblique bi-planar fluoroscopic views.  After negative aspirate for air, blood, and CSF, a 2 ml. volume of Isovue-250 was injected into the epidural space and the flow of contrast was observed. Radiographs were obtained for documentation purposes.    The injectate was administered into the level noted above.   Additional Comments:  No complications occurred Dressing: 2 x 2 sterile gauze and Band-Aid    Post-procedure details: Patient was observed during the procedure. Post-procedure instructions were reviewed.  Patient left the clinic in stable condition.   Clinical History: MRI LUMBAR SPINE WITHOUT CONTRAST   TECHNIQUE: Multiplanar, multisequence MR imaging of the lumbar spine was performed. No intravenous contrast was administered.   COMPARISON:  X-ray 07/29/2021   FINDINGS: Segmentation:  Standard.   Alignment:  9 mm grade 2 anterolisthesis L4 on L5.   Vertebrae: No fracture, evidence of discitis, or bone lesion. Discogenic endplate marrow changes at L4-5 and L5-S1.   Conus medullaris and cauda equina: Conus extends  to the L1-2 level. Conus and cauda equina appear normal.   Paraspinal and other soft tissues: No acute findings.   Disc levels:   Small disc bulges at T9-10, T10-11, and T11-12 within the included lower thoracic spine. Mild stenosis along the right side of the canal at T10-11 where there is also mild right-sided foraminal stenosis.   T12-L1: No significant disc protrusion, foraminal stenosis, or canal stenosis.   L1-L2: Shallow right paracentral disc protrusion. No foraminal or canal stenosis.   L2-L3: Mild annular disc bulge, slightly eccentric to the left. No foraminal or canal stenosis.   L3-L4: Mild annular disc bulge. Mild bilateral facet hypertrophy and ligamentum flavum buckling. No significant canal or foraminal stenosis.   L4-L5: Grade 2 anterolisthesis with disc uncovering and mild disc bulge. Severe bilateral facet arthropathy. Mild ligamentum flavum buckling. Findings result in moderate to severe canal stenosis with moderate to severe right and mild-to-moderate left foraminal stenosis.   L5-S1: Disc height loss with mild disc bulge and endplate ridging. Mild bilateral facet arthropathy. Mild right-sided subarticular recess stenosis. No canal stenosis. Mild-to-moderate bilateral foraminal stenosis.   IMPRESSION: 1. 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 2. Mild-to-moderate bilateral foraminal stenosis at L5-S1.     Electronically Signed   By: Duanne Guess D.O.   On: 08/30/2021 14:04     Objective:  VS:  HT:    WT:   BMI:     BP:137/82  HR:64bpm  TEMP: ( )  RESP:  Physical Exam Vitals and nursing note reviewed.  Constitutional:      General: She is not in acute distress.    Appearance: Normal appearance. She is not ill-appearing.  HENT:     Head: Normocephalic and atraumatic.     Right Ear: External ear normal.     Left  Ear: External ear normal.  Eyes:     Extraocular Movements: Extraocular movements intact.  Cardiovascular:     Rate and Rhythm: Normal rate.     Pulses: Normal pulses.  Pulmonary:     Effort: Pulmonary effort is normal. No respiratory distress.  Abdominal:     General: There is no distension.     Palpations: Abdomen is soft.  Musculoskeletal:        General: Tenderness present.     Cervical back: Neck supple.     Right lower leg: No edema.     Left lower leg: No edema.     Comments: Patient has good distal strength with no pain over the greater trochanters.  No clonus or focal weakness.  Skin:    Findings: No erythema, lesion or rash.  Neurological:     General: No focal deficit present.     Mental Status: She is alert and oriented to person, place, and time.     Sensory: No sensory deficit.     Motor: No weakness or abnormal muscle tone.     Coordination: Coordination normal.  Psychiatric:        Mood and Affect: Mood normal.        Behavior: Behavior normal.      Imaging: XR C-ARM NO REPORT  Result Date: 11/11/2022 Please see Notes tab for imaging impression.

## 2022-11-11 NOTE — Progress Notes (Signed)
Functional Pain Scale - descriptive words and definitions  Moderate (4)   Constantly aware of pain, can complete ADLs with modification/sleep marginally affected at times/passive distraction is of no use, but active distraction gives some relief. Moderate range order  Average Pain 5   +Driver, -BT, -Dye Allergies.  Lower back pain in the center that radiates to below the buttocks on both sides. Pain mostly when standing or walking

## 2023-01-10 ENCOUNTER — Encounter: Payer: Self-pay | Admitting: Pulmonary Disease

## 2023-01-11 MED ORDER — FLUTICASONE FUROATE-VILANTEROL 100-25 MCG/ACT IN AEPB: 1.0000 | INHALATION_SPRAY | Freq: Every day | RESPIRATORY_TRACT | 3 refills | Status: DC

## 2023-01-21 ENCOUNTER — Encounter: Payer: Self-pay | Admitting: Pulmonary Disease

## 2023-01-21 DIAGNOSIS — R9389 Abnormal findings on diagnostic imaging of other specified body structures: Secondary | ICD-10-CM

## 2023-02-02 ENCOUNTER — Ambulatory Visit (HOSPITAL_BASED_OUTPATIENT_CLINIC_OR_DEPARTMENT_OTHER)
Admission: RE | Admit: 2023-02-02 | Discharge: 2023-02-02 | Disposition: A | Discharge status: Home / Self Care | Payer: Medicare HMO | Source: Ambulatory Visit | Attending: Pulmonary Disease | Admitting: Pulmonary Disease

## 2023-02-02 DIAGNOSIS — R9389 Abnormal findings on diagnostic imaging of other specified body structures: Secondary | ICD-10-CM | POA: Diagnosis present

## 2023-02-24 ENCOUNTER — Other Ambulatory Visit: Payer: Medicare HMO

## 2023-03-02 ENCOUNTER — Encounter: Payer: Self-pay | Admitting: Pulmonary Disease

## 2023-03-02 ENCOUNTER — Ambulatory Visit: Payer: Medicare HMO | Admitting: Pulmonary Disease

## 2023-03-02 VITALS — BP 124/80 | HR 78 | Temp 97.2°F | Ht 64.0 in | Wt 160.0 lb

## 2023-03-02 DIAGNOSIS — R9389 Abnormal findings on diagnostic imaging of other specified body structures: Secondary | ICD-10-CM | POA: Diagnosis not present

## 2023-03-02 NOTE — Patient Instructions (Signed)
Repeat CT scan of the chest without contrast in 3 months  Continue using Breo  Continue graded exercise as tolerated  Call us with significant concerns

## 2023-03-02 NOTE — Progress Notes (Signed)
Kristy Jacobs    [id number]    06/08/1946  Primary Care Physician:Clark, Jennye Moccasin, MD  Referring Physician: Melvenia Beam, MD [address]  [address] [place],  [place] [address]  Chief complaint: Being seen for shortness of breath Breathing remains about the same  HPI:  Breathing feels about the same  Continues with Breo  No significant changes in her breathing, no limitations with activities, no new symptoms like cough weight loss no night sweats Continues to try to stay active  Lung nodules identified on previous CT she is at to follow-up CTs with some nodules that have improved and a couple of nodules that were discovered during the last CT stayed stable this time, 1 nodule did increase in size by 1 mm,, new area of groundglass infiltrate also noted  Breathing has generally been stable  She does have some arthritis, up-to-date with colonoscopy, mammograms, no gynecological bleeding  She remains compliant with inhalers  She does use Atrovent nasal and Flonase for nasal stuffiness  Rarely needs albuterol  PFT with severe obstructive disease  She does have iron deficiency  Never smoker Some exposure to secondhand smoke  No occupational predisposition to lung disease  Outpatient Encounter Medications as of 03/02/2023  Medication Sig   amLODipine (NORVASC) 2.5 MG tablet Take 1 tablet (2.5 mg total) by mouth daily.   ascorbic acid (VITAMIN C) 500 MG tablet Take 500 mg by mouth daily.   ferrous sulfate 325 (65 FE) MG EC tablet Take 325 mg by mouth 3 (three) times daily with meals.   fluticasone (FLONASE) 50 MCG/ACT nasal spray Place into both nostrils daily.   fluticasone furoate-vilanterol (BREO ELLIPTA) 100-25 MCG/ACT AEPB Inhale 1 puff into the lungs daily.   ipratropium (ATROVENT) 0.03 % nasal spray as needed.   montelukast (SINGULAIR) 10 MG tablet Take 1 tablet (10 mg total) by mouth at bedtime.   nystatin (MYCOSTATIN/NYSTOP) powder  as needed.   Probiotic Product (PROBIOTIC PO) Take 1 tablet by mouth daily.   propranolol (INDERAL) 10 MG tablet Take 10 mg by mouth as needed.   valsartan (DIOVAN) 320 MG tablet TAKE 1 TABLET BY MOUTH DAILY   Magnesium 500 MG CAPS Take 200 mg by mouth every other day. (Patient not taking: Reported on 03/02/2023)   No facility-administered encounter medications on file as of 03/02/2023.    Allergies as of 03/02/2023 - Review Complete 03/02/2023  Allergen Reaction Noted   Ace inhibitors Cough 02/28/2020   Compazine [prochlorperazine edisylate] Nausea And Vomiting 01/28/2014   Hctz [hydrochlorothiazide]  04/17/2020    Past Medical History:  Diagnosis Date   Allergy    Arthritis    Hypertension     Past Surgical History:  Procedure Laterality Date   NO PAST SURGERIES      Family History  Problem Relation Age of Onset   Hypertension Mother    Hypertension Father    Cancer Father    Stroke Father    Hypertension Brother    Hypertension Sister     Social History   Socioeconomic History   Marital status: Married    Spouse name: Not on file   Number of children: Not on file   Years of education: Not on file   Highest education level: Not on file  Occupational History   Not on file  Tobacco Use   Smoking status: Never   Smokeless tobacco: Never  Substance and Sexual Activity   Alcohol use:  Yes    Alcohol/week: 2.0 standard drinks of alcohol    Types: 2 Glasses of wine per week   Drug use: No   Sexual activity: Yes  Other Topics Concern   Not on file  Social History Narrative   Not on file   Social Determinants of Health   Financial Resource Strain: Low Risk  (02/19/2022)   Received from [facility], [facility]   Overall Financial Resource Strain (CARDIA)    Difficulty of Paying Living Expenses: Not hard at all  Food Insecurity: Not on file  Transportation Needs: Not on file  Physical Activity: Not on file  Stress: No Stress Concern Present (02/19/2022)    Received from [facility], [facility] - Occupational Stress Questionnaire    Feeling of Stress : Not at all  Social Connections: Unknown (01/06/2022)   Received from [facility], [facility]   Social Network    Social Network: Not on file  Intimate Partner Violence: Unknown (01/06/2022)   Received from [facility], [facility]   HITS    Physically Hurt: Not on file    Insult or Talk Down To: Not on file    Threaten Physical Harm: Not on file    Scream or Curse: Not on file    Review of Systems  Respiratory:  Positive for shortness of breath. Negative for cough.   Cardiovascular:  Negative for chest pain.  All other systems reviewed and are negative.   Vitals:   03/02/23 0941  BP: 124/80  Pulse: 78  Temp: (!) 97.2 F (36.2 C)  SpO2: 99%      Physical Exam Constitutional:      Appearance: Normal appearance.  HENT:     Head: Normocephalic.     Nose: No congestion.     Mouth/Throat:     Mouth: Mucous membranes are moist.     Pharynx: No posterior oropharyngeal erythema.  Eyes:     General:        Right eye: No discharge.        Left eye: No discharge.  Cardiovascular:     Rate and Rhythm: Normal rate and regular rhythm.     Heart sounds: No murmur heard.    No friction rub.  Pulmonary:     Effort: No respiratory distress.     Breath sounds: No stridor. No wheezing.  Musculoskeletal:     Cervical back: No rigidity or tenderness.  Neurological:     Mental Status: She is alert.  Psychiatric:        Mood and Affect: Mood normal.   Data Reviewed: Chest x-ray 03/11/2020-no infiltrative process noted on chest x-ray reviewed with the patient  Exercise stress study reviewed  PFT with severe obstructive disease with no significant bronchodilator response  CT scan was reviewed reviewed in the office today with the patient, comparing multiple Cts  Assessment:   Multiple lung nodules -Multiple CTs reviewed  showing stable nodules, 1 nodule did increase by 1 mm, couple of nodules did decrease in size, an area of groundglass infiltrate did resolve and a new area of groundglass infiltrate not also noted  Shortness of breath with activity -Symptoms have been stable with Breo -No significant worsening -Continues to tolerate activity well  Chronic obstructive pulmonary disease -Obstructive defect on PFT with some improvement with bronchodilators  She does not feel any worse with her breathing -Will stay on the Breo at present   Plan/Recommendations: Encouraged to continue  Breo  Because of a new 11 mm groundglass infiltrate noted, will repeat CT scan in 3 months  Encouraged to give [place] a call with any significant concerns  Follow-up 3 months from here  Encouraged to continue graded activities as tolerated  Virl Diamond MD [facility] 03/02/2023, 9:46 AM  CC: Melvenia Beam,*

## 2023-03-31 ENCOUNTER — Encounter: Payer: Self-pay | Admitting: Physical Medicine and Rehabilitation

## 2023-04-01 ENCOUNTER — Other Ambulatory Visit: Payer: Self-pay | Admitting: Physical Medicine and Rehabilitation

## 2023-04-01 DIAGNOSIS — M5416 Radiculopathy, lumbar region: Secondary | ICD-10-CM

## 2023-04-01 DIAGNOSIS — M48062 Spinal stenosis, lumbar region with neurogenic claudication: Secondary | ICD-10-CM

## 2023-04-07 ENCOUNTER — Other Ambulatory Visit: Payer: Self-pay

## 2023-04-07 ENCOUNTER — Ambulatory Visit: Payer: Medicare HMO | Admitting: Physical Medicine and Rehabilitation

## 2023-04-07 VITALS — BP 147/80 | HR 80

## 2023-04-07 DIAGNOSIS — M5416 Radiculopathy, lumbar region: Secondary | ICD-10-CM | POA: Diagnosis not present

## 2023-04-07 MED ORDER — METHYLPREDNISOLONE ACETATE 40 MG/ML IJ SUSP
40.0000 mg | Freq: Once | INTRAMUSCULAR | Status: AC
  Administered 2023-04-07: 40 mg

## 2023-04-07 NOTE — Patient Instructions (Signed)

## 2023-04-07 NOTE — Progress Notes (Signed)
Functional Pain Scale - descriptive words and definitions  Distracting (5)    Aware of pain/able to complete some ADL's but limited by pain/sleep is affected and active distractions are only slightly useful. Moderate range order  Average Pain 6   +Driver, -BT, -Dye Allergies.  Lower back pain on both sides that radiates into the hips and buttocks

## 2023-04-14 NOTE — Progress Notes (Signed)
Kristy Jacobs - 77 y.o. female MRN [id number]  Date of birth: Dec 24, 1945  Office Visit Note: Visit Date: 04/07/2023 PCP: Melvenia Beam, MD Referred by: Melvenia Beam,*  Subjective: Chief Complaint  Patient presents with   Lower Back - Pain   HPI:  Kristy Jacobs is a 77 y.o. female who comes in today for planned repeat Right L5-S1  Lumbar Interlaminar epidural steroid injection with fluoroscopic guidance.  The patient has failed conservative care including home exercise, medications, time and activity modification.  This injection will be diagnostic and hopefully therapeutic.  Please see requesting physician notes for further details and justification. Patient received more than 50% pain relief from prior injection.   Referring: Ellin Goodie, FNP   ROS Otherwise per HPI.  Assessment & Plan: Visit Diagnoses:    ICD-10-CM   1. Lumbar radiculopathy  M54.16 XR C-ARM NO REPORT    Epidural Steroid injection    methylPREDNISolone acetate (DEPO-MEDROL) injection 40 mg      Plan: No additional findings.   Meds & Orders:  Meds ordered this encounter  Medications   methylPREDNISolone acetate (DEPO-MEDROL) injection 40 mg    Orders Placed This Encounter  Procedures   XR C-ARM NO REPORT   Epidural Steroid injection    Follow-up: Return for visit to requesting provider as needed.   Procedures: No procedures performed  Lumbar Epidural Steroid Injection - Interlaminar Approach with Fluoroscopic Guidance  Patient: Kristy Jacobs      Date of Birth: [birth year] MRN: [id number] PCP: Melvenia Beam, MD      Visit Date: 04/07/2023   Universal Protocol:     Consent Given By: the patient  Position: PRONE  Additional Comments: Vital signs were monitored before and after the procedure. Patient was prepped and draped in the usual sterile fashion. The correct patient, procedure, and site was verified.   Injection Procedure Details:   Procedure diagnoses:  Lumbar radiculopathy [M54.16]   Meds Administered:  Meds ordered this encounter  Medications   methylPREDNISolone acetate (DEPO-MEDROL) injection 40 mg     Laterality: Right  Location/Site:  L5-S1  Needle: 3.5 in., 20 ga. Tuohy  Needle Placement: Paramedian epidural  Findings:   -Comments: Excellent flow of contrast into the epidural space.  Procedure Details: Using a paramedian approach from the side mentioned above, the region overlying the inferior lamina was localized under fluoroscopic visualization and the soft tissues overlying this structure were infiltrated with 4 ml. of 1% Lidocaine without Epinephrine. The Tuohy needle was inserted into the epidural space using a paramedian approach.   The epidural space was localized using loss of resistance along with counter oblique bi-planar fluoroscopic views.  After negative aspirate for air, blood, and CSF, a 2 ml. volume of Isovue-250 was injected into the epidural space and the flow of contrast was observed. Radiographs were obtained for documentation purposes.    The injectate was administered into the level noted above.   Additional Comments:  The patient tolerated the procedure well Dressing: 2 x 2 sterile gauze and Band-Aid    Post-procedure details: Patient was observed during the procedure. Post-procedure instructions were reviewed.  Patient left the clinic in stable condition.   Clinical History: MRI LUMBAR SPINE WITHOUT CONTRAST   TECHNIQUE: Multiplanar, multisequence MR imaging of the lumbar spine was performed. No intravenous contrast was administered.   COMPARISON:  X-ray 07/29/2021   FINDINGS: Segmentation:  Standard.   Alignment:  9 mm grade 2 anterolisthesis L4 on L5.   Vertebrae: No  fracture, evidence of discitis, or bone lesion. Discogenic endplate marrow changes at L4-5 and L5-S1.   Conus medullaris and cauda equina: Conus extends to the L1-2 level. Conus and cauda equina appear normal.    Paraspinal and other soft tissues: No acute findings.   Disc levels:   Small disc bulges at T9-10, T10-11, and T11-12 within the included lower thoracic spine. Mild stenosis along the right side of the canal at T10-11 where there is also mild right-sided foraminal stenosis.   T12-L1: No significant disc protrusion, foraminal stenosis, or canal stenosis.   L1-L2: Shallow right paracentral disc protrusion. No foraminal or canal stenosis.   L2-L3: Mild annular disc bulge, slightly eccentric to the left. No foraminal or canal stenosis.   L3-L4: Mild annular disc bulge. Mild bilateral facet hypertrophy and ligamentum flavum buckling. No significant canal or foraminal stenosis.   L4-L5: Grade 2 anterolisthesis with disc uncovering and mild disc bulge. Severe bilateral facet arthropathy. Mild ligamentum flavum buckling. Findings result in moderate to severe canal stenosis with moderate to severe right and mild-to-moderate left foraminal stenosis.   L5-S1: Disc height loss with mild disc bulge and endplate ridging. Mild bilateral facet arthropathy. Mild right-sided subarticular recess stenosis. No canal stenosis. Mild-to-moderate bilateral foraminal stenosis.   IMPRESSION: 1. 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 2. Mild-to-moderate bilateral foraminal stenosis at L5-S1.     Electronically Signed   By: Duanne Guess D.O.   On: 08/30/2021 14:04     Objective:  VS:  HT:    WT:   BMI:     BP:(!) 147/80  HR:80bpm  TEMP: ( )  RESP:  Physical Exam Vitals and nursing note reviewed.  Constitutional:      General: She is not in acute distress.    Appearance: Normal appearance. She is not ill-appearing.  HENT:     Head: Normocephalic and atraumatic.     Right Ear: External ear normal.     Left Ear: External ear normal.  Eyes:     Extraocular  Movements: Extraocular movements intact.  Cardiovascular:     Rate and Rhythm: Normal rate.     Pulses: Normal pulses.  Pulmonary:     Effort: Pulmonary effort is normal. No respiratory distress.  Abdominal:     General: There is no distension.     Palpations: Abdomen is soft.  Musculoskeletal:        General: Tenderness present.     Cervical back: Neck supple.     Right lower leg: No edema.     Left lower leg: No edema.     Comments: Patient has good distal strength with no pain over the greater trochanters.  No clonus or focal weakness.  Skin:    Findings: No erythema, lesion or rash.  Neurological:     General: No focal deficit present.     Mental Status: She is alert and oriented to person, place, and time.     Sensory: No sensory deficit.     Motor: No weakness or abnormal muscle tone.     Coordination: Coordination normal.  Psychiatric:        Mood and Affect: Mood normal.        Behavior: Behavior normal.      Imaging: No results found.

## 2023-04-14 NOTE — Procedures (Signed)
Lumbar Epidural Steroid Injection - Interlaminar Approach with Fluoroscopic Guidance  Patient: Kristy Jacobs      Date of Birth: Jun 12, 1946 MRN: [id number] PCP: Melvenia Beam, MD      Visit Date: 04/07/2023   Universal Protocol:     Consent Given By: the patient  Position: PRONE  Additional Comments: Vital signs were monitored before and after the procedure. Patient was prepped and draped in the usual sterile fashion. The correct patient, procedure, and site was verified.   Injection Procedure Details:   Procedure diagnoses: Lumbar radiculopathy [M54.16]   Meds Administered:  Meds ordered this encounter  Medications   methylPREDNISolone acetate (DEPO-MEDROL) injection 40 mg     Laterality: Right  Location/Site:  L5-S1  Needle: 3.5 in., 20 ga. Tuohy  Needle Placement: Paramedian epidural  Findings:   -Comments: Excellent flow of contrast into the epidural space.  Procedure Details: Using a paramedian approach from the side mentioned above, the region overlying the inferior lamina was localized under fluoroscopic visualization and the soft tissues overlying this structure were infiltrated with 4 ml. of 1% Lidocaine without Epinephrine. The Tuohy needle was inserted into the epidural space using a paramedian approach.   The epidural space was localized using loss of resistance along with counter oblique bi-planar fluoroscopic views.  After negative aspirate for air, blood, and CSF, a 2 ml. volume of Isovue-250 was injected into the epidural space and the flow of contrast was observed. Radiographs were obtained for documentation purposes.    The injectate was administered into the level noted above.   Additional Comments:  The patient tolerated the procedure well Dressing: 2 x 2 sterile gauze and Band-Aid    Post-procedure details: Patient was observed during the procedure. Post-procedure instructions were reviewed.  Patient left the clinic in stable  condition.

## 2023-05-26 ENCOUNTER — Ambulatory Visit (HOSPITAL_BASED_OUTPATIENT_CLINIC_OR_DEPARTMENT_OTHER)
Admission: RE | Admit: 2023-05-26 | Discharge: 2023-05-26 | Disposition: A | Discharge status: Home / Self Care | Payer: Medicare HMO | Source: Ambulatory Visit | Attending: Pulmonary Disease | Admitting: Pulmonary Disease

## 2023-05-26 DIAGNOSIS — R9389 Abnormal findings on diagnostic imaging of other specified body structures: Secondary | ICD-10-CM | POA: Diagnosis present

## 2023-05-26 IMAGING — MR MR LUMBAR SPINE W/O CM
4 of 5 series · 25 of 48 positions shown · non-contrast
Comparison: X-ray 07/29/2021

CLINICAL DATA: Low back pain with right-sided radiculopathy for 1
month

EXAM:
MRI LUMBAR SPINE WITHOUT CONTRAST
TECHNIQUE: Multiplanar, multisequence MR imaging of the lumbar spine was
performed. No intravenous contrast was administered.

[Series 4: T2 · sagittal · 4.0mm · 0.59mm/px · 6 of 19 slices shown (1 of 2)]
[im 1/19]
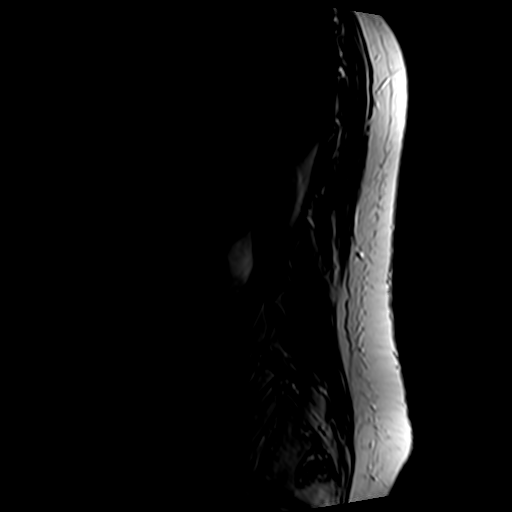
[im 4/19]
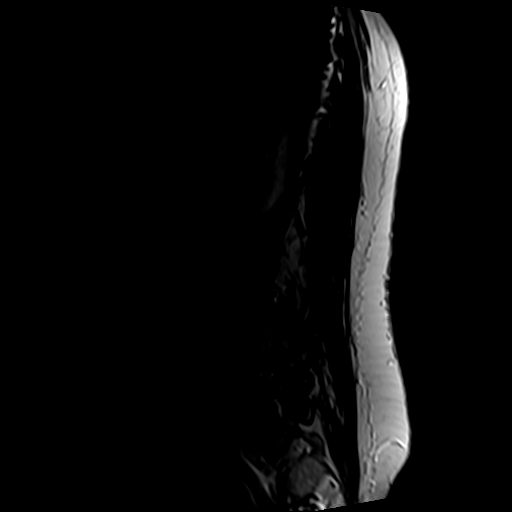
[im 8/19]
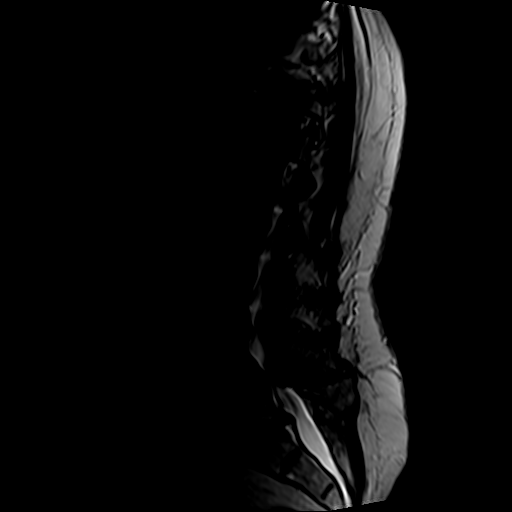
[im 11/19]
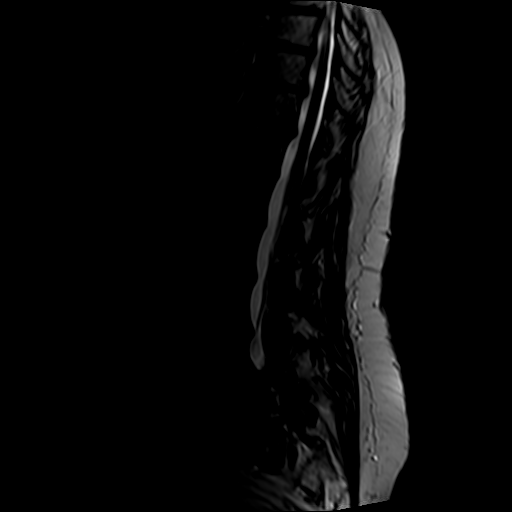
[im 15/19]
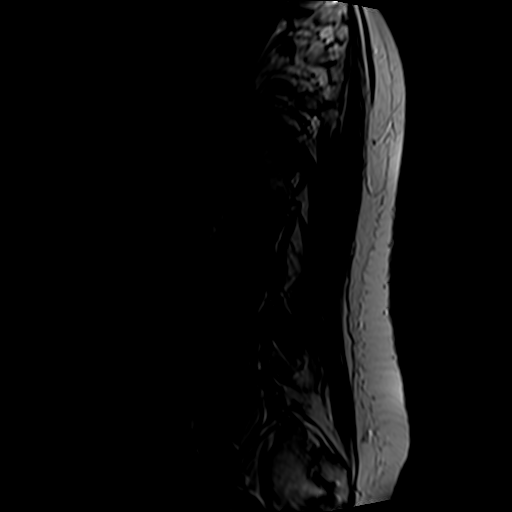
[im 19/19]
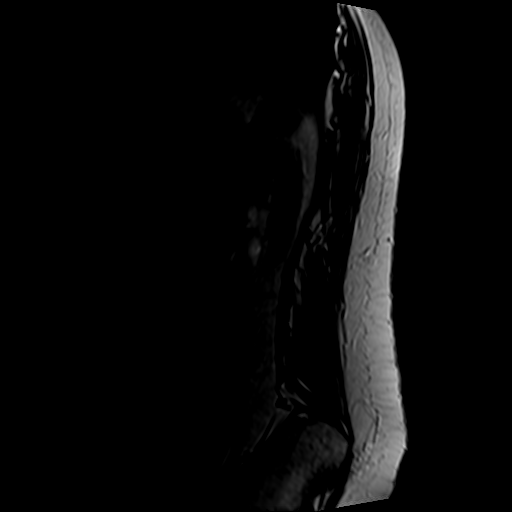

[Series 5: T1 · sagittal · 4.0mm · 0.59mm/px · 6 of 19 slices shown (1 of 2)]
[im 1/19]
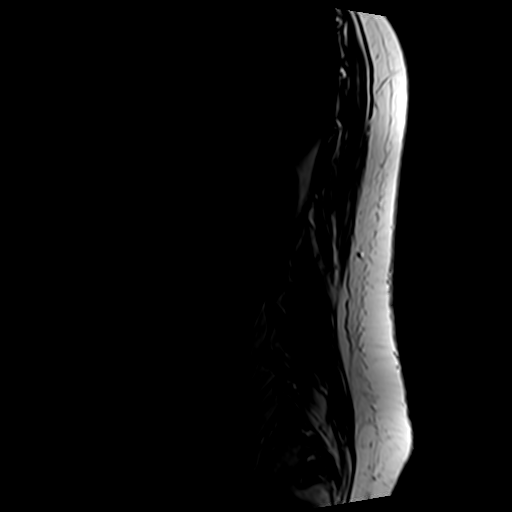
[im 4/19]
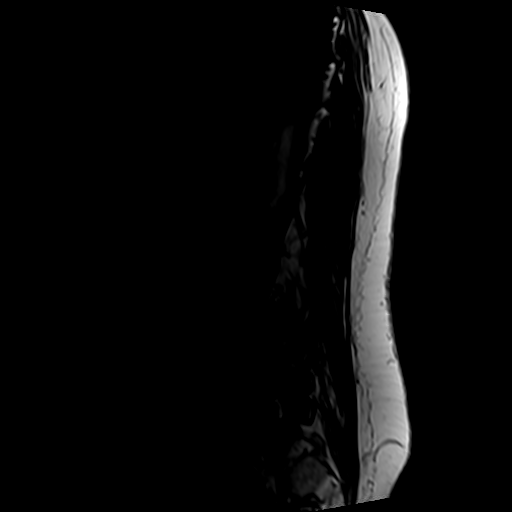
[im 8/19]
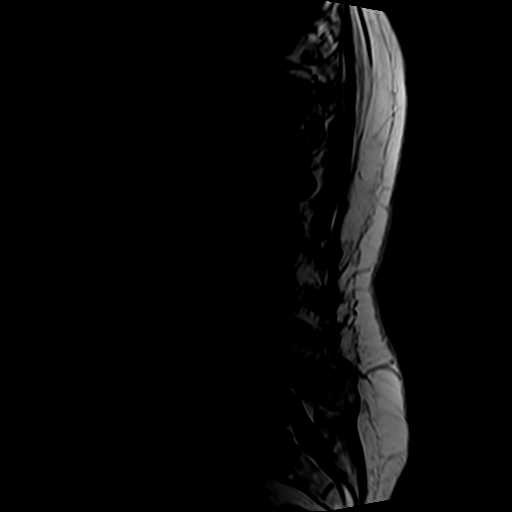
[im 11/19]
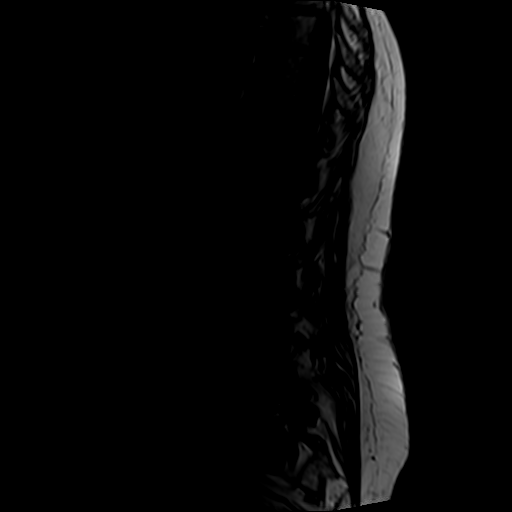
[im 15/19]
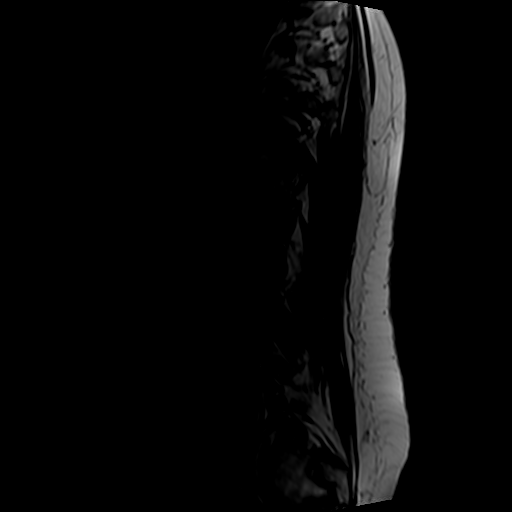
[im 19/19]
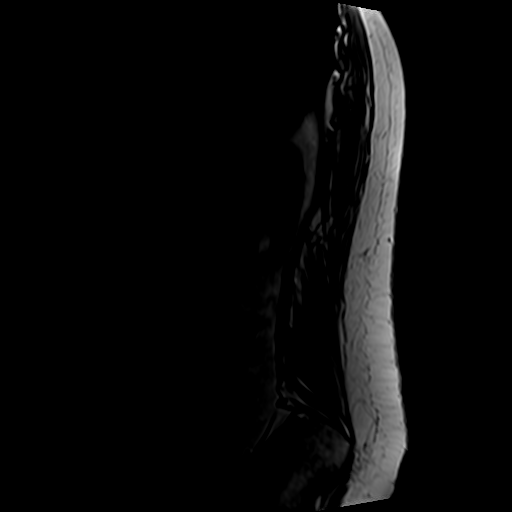

[Series 6: T2 · axial · 4.0mm · 0.78mm/px · z∈[-93,+160]mm · 9 of 49 slices shown (2 of 2)]
[im 1/49]
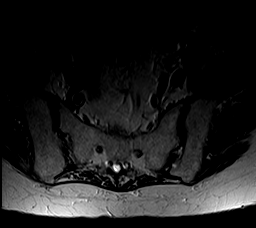
[im 7/49]
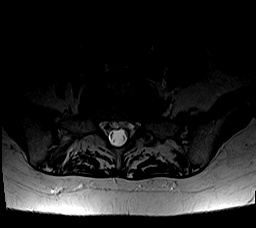
[im 14/49]
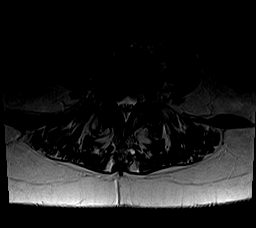
[im 21/49]
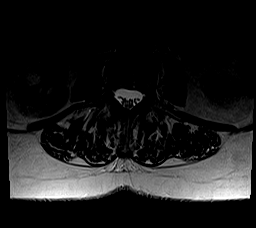
[im 25/49]
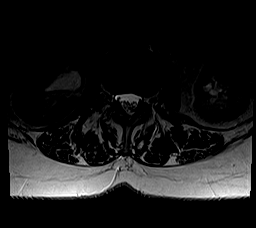
[im 28/49]
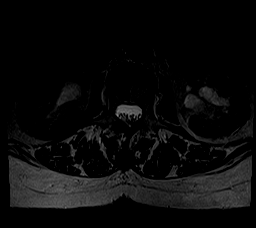
[im 35/49]
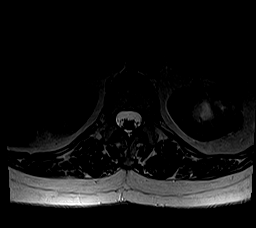
[im 42/49]
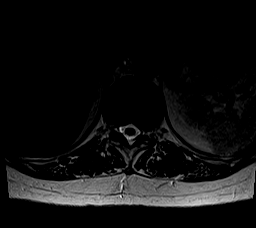
[im 49/49]
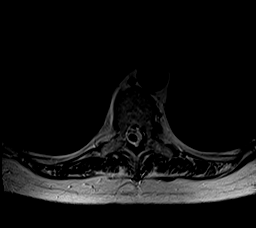

[Series 7: T1 · axial · 4.0mm · 0.39mm/px · z∈[-93,+124]mm · 4 of 49 slices shown (2 of 2)]
[im 1/49]
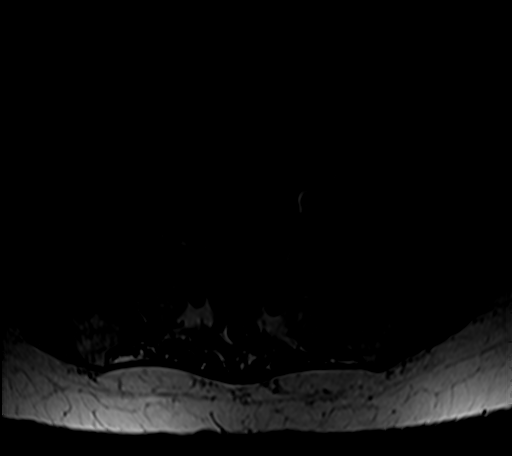
[im 7/49]
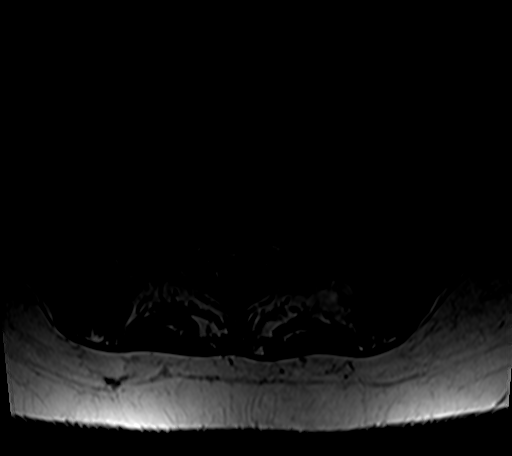
[im 25/49]
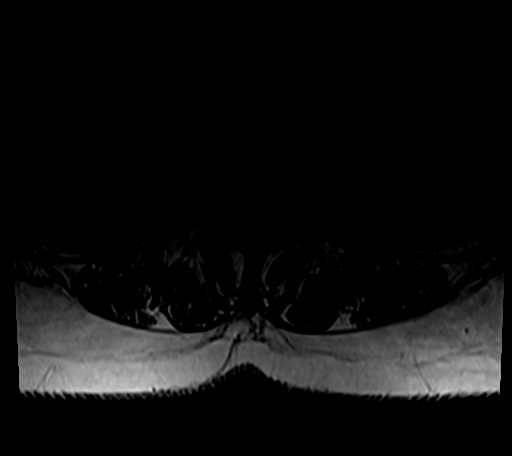
[im 42/49]
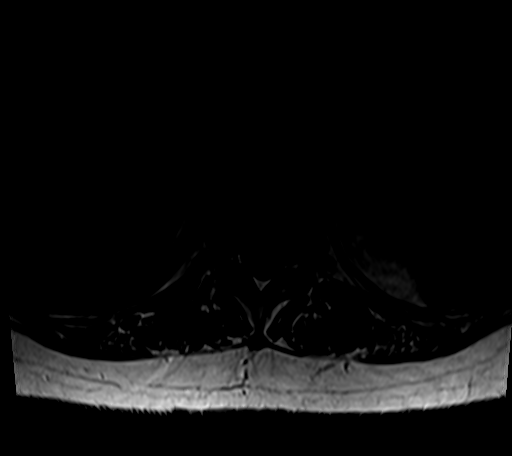

[25 of 48 positions shown; findings below may reference images not displayed]

FINDINGS: Segmentation:  Standard.

Alignment:  9 mm grade 2 anterolisthesis L4 on L5.

Vertebrae: No fracture, evidence of discitis, or bone lesion.
Discogenic endplate marrow changes at L4-5 and L5-S1.

Conus medullaris and cauda equina: Conus extends to the L1-2 level.
Conus and cauda equina appear normal.

Paraspinal and other soft tissues: No acute findings.

Disc levels:

Small disc bulges at T9-10, T10-11, and T11-12 within the included
lower thoracic spine. Mild stenosis along the right side of the
canal at T10-11 where there is also mild right-sided foraminal
stenosis.

T12-L1: No significant disc protrusion, foraminal stenosis, or canal
stenosis.

L1-L2: Shallow right paracentral disc protrusion. No foraminal or
canal stenosis.

L2-L3: Mild annular disc bulge, slightly eccentric to the left. No
foraminal or canal stenosis.

L3-L4: Mild annular disc bulge. Mild bilateral facet hypertrophy and
ligamentum flavum buckling. No significant canal or foraminal
stenosis.

L4-L5: Grade 2 anterolisthesis with disc uncovering and mild disc
bulge. Severe bilateral facet arthropathy. Mild ligamentum flavum
buckling. Findings result in moderate to severe canal stenosis with
moderate to severe right and mild-to-moderate left foraminal
stenosis.

L5-S1: Disc height loss with mild disc bulge and endplate ridging.
Mild bilateral facet arthropathy. Mild right-sided subarticular
recess stenosis. No canal stenosis. Mild-to-moderate bilateral
foraminal stenosis.
IMPRESSION: 1. Multilevel lumbar spondylosis most pronounced at L4-5 where there
is moderate-to-severe canal stenosis with moderate-to-severe right
sided foraminal stenosis.
2. Mild-to-moderate bilateral foraminal stenosis at L5-S1.

## 2023-05-27 ENCOUNTER — Other Ambulatory Visit: Payer: Medicare HMO

## 2023-06-02 ENCOUNTER — Other Ambulatory Visit (HOSPITAL_BASED_OUTPATIENT_CLINIC_OR_DEPARTMENT_OTHER): Payer: Medicare HMO

## 2023-06-09 ENCOUNTER — Encounter: Payer: Self-pay | Admitting: Pulmonary Disease

## 2023-06-09 ENCOUNTER — Ambulatory Visit: Payer: Medicare HMO | Admitting: Pulmonary Disease

## 2023-06-09 VITALS — BP 142/82 | HR 67 | Ht 64.0 in | Wt 161.0 lb

## 2023-06-09 DIAGNOSIS — R918 Other nonspecific abnormal finding of lung field: Secondary | ICD-10-CM

## 2023-06-09 NOTE — Patient Instructions (Signed)
We will schedule you for repeat CT scan in 6 months to follow-up on the lung nodules  They have been stable  Call us with significant concerns  I will see you 6 months from here

## 2023-06-09 NOTE — Progress Notes (Signed)
Kristy Jacobs    [id number]    02-22-1946  Primary Care Physician:Clark, Jennye Moccasin, MD  Referring Physician: Melvenia Beam, MD [address]  [address] [place],  [place] [address]  Chief complaint: Being seen for shortness of breath Breathing remains about the same  HPI:  Breathing feels about the same  Continues with Virgel Bouquet   She is in today for follow-up of lung nodules -Recently had CT scan showing multiple lung nodules -CT was compared during the visit today and shows stable nodules -A new groundglass infiltrate in the left upper lobe has resolved  Her breathing remains generally stable  She does have some arthritis, up-to-date with colonoscopy, mammograms, no gynecological bleeding  She remains compliant with inhalers  She does use Atrovent nasal and Flonase for nasal stuffiness  Rarely needs albuterol  PFT with severe obstructive disease  She does have iron deficiency  Never smoker Some exposure to secondhand smoke  No occupational predisposition to lung disease  Outpatient Encounter Medications as of 06/09/2023  Medication Sig   amLODipine (NORVASC) 2.5 MG tablet Take 1 tablet (2.5 mg total) by mouth daily.   ascorbic acid (VITAMIN C) 500 MG tablet Take 500 mg by mouth daily.   ferrous sulfate 325 (65 FE) MG EC tablet Take 325 mg by mouth 3 (three) times daily with meals.   fluticasone (FLONASE) 50 MCG/ACT nasal spray Place into both nostrils daily.   fluticasone furoate-vilanterol (BREO ELLIPTA) 100-25 MCG/ACT AEPB Inhale 1 puff into the lungs daily.   ipratropium (ATROVENT) 0.03 % nasal spray as needed.   montelukast (SINGULAIR) 10 MG tablet Take 1 tablet (10 mg total) by mouth at bedtime.   nystatin (MYCOSTATIN/NYSTOP) powder as needed.   Probiotic Product (PROBIOTIC PO) Take 1 tablet by mouth daily.   propranolol (INDERAL) 10 MG tablet Take 10 mg by mouth as needed.   valsartan (DIOVAN) 320 MG tablet TAKE 1 TABLET BY MOUTH  DAILY   Magnesium 500 MG CAPS Take 200 mg by mouth every other day. (Patient not taking: Reported on 06/09/2023)   No facility-administered encounter medications on file as of 06/09/2023.    Allergies as of 06/09/2023 - Review Complete 06/09/2023  Allergen Reaction Noted   Ace inhibitors Cough 02/28/2020   Compazine [prochlorperazine edisylate] Nausea And Vomiting 01/28/2014   Hctz [hydrochlorothiazide]  04/17/2020    Past Medical History:  Diagnosis Date   Allergy    Arthritis    Hypertension     Past Surgical History:  Procedure Laterality Date   NO PAST SURGERIES      Family History  Problem Relation Age of Onset   Hypertension Mother    Hypertension Father    Cancer Father    Stroke Father    Hypertension Brother    Hypertension Sister     Social History   Socioeconomic History   Marital status: Married    Spouse name: Not on file   Number of children: Not on file   Years of education: Not on file   Highest education level: Not on file  Occupational History   Not on file  Tobacco Use   Smoking status: Never   Smokeless tobacco: Never  Substance and Sexual Activity   Alcohol use: Yes    Alcohol/week: 2.0 standard drinks of alcohol    Types: 2 Glasses of wine per week   Drug use: No   Sexual activity: Yes  Other Topics Concern   Not  on file  Social History Narrative   Not on file   Social Drivers of Health   Financial Resource Strain: Low Risk  (02/19/2022)   Received from [facility], [facility]   Overall Financial Resource Strain (CARDIA)    Difficulty of Paying Living Expenses: Not hard at all  Food Insecurity: Low Risk  (04/26/2023)   Received from [facility]   Hunger Vital Sign    Worried About Running Out of Food in the Last Year: Never true    Ran Out of Food in the Last Year: Never true  Transportation Needs: No Transportation Needs (04/26/2023)   Received from [facility]    In the past 12 months, has lack of  reliable transportation kept you from medical appointments, meetings, work or from getting things needed for daily living? : No  Physical Activity: Not on file  Stress: No Stress Concern Present (02/19/2022)   Received from [facility], [facility]   [employer] of Occupational Health - Occupational Stress Questionnaire    Feeling of Stress : Not at all  Social Connections: Unknown (01/06/2022)   Received from [facility], [facility]   Social Network    Social Network: Not on file  Intimate Partner Violence: Unknown (01/06/2022)   Received from [facility], [facility]   HITS    Physically Hurt: Not on file    Insult or Talk Down To: Not on file    Threaten Physical Harm: Not on file    Scream or Curse: Not on file    Review of Systems  Respiratory:  Positive for shortness of breath. Negative for cough.   Cardiovascular:  Negative for chest pain.  All other systems reviewed and are negative.   Vitals:   06/09/23 0959  BP: (!) 142/82  Pulse: 67  SpO2: 97%      Physical Exam Constitutional:      Appearance: Normal appearance.  HENT:     Head: Normocephalic.     Nose: No congestion.     Mouth/Throat:     Mouth: Mucous membranes are moist.     Pharynx: No posterior oropharyngeal erythema.  Eyes:     General:        Right eye: No discharge.        Left eye: No discharge.  Cardiovascular:     Rate and Rhythm: Normal rate and regular rhythm.     Heart sounds: No murmur heard.    No friction rub.  Pulmonary:     Effort: No respiratory distress.     Breath sounds: No stridor. No wheezing.  Musculoskeletal:     Cervical back: No rigidity or tenderness.  Neurological:     Mental Status: She is alert.  Psychiatric:        Mood and Affect: Mood normal.    Data Reviewed: Chest x-ray 03/11/2020-no infiltrative process noted on chest x-ray reviewed with the patient  Exercise stress study reviewed  PFT with severe obstructive disease with no significant  bronchodilator response  CT scan was reviewed reviewed in the office today with the patient, comparing multiple Cts -Most recent CT 05/26/2023 was reviewed  Assessment:   Multiple lung nodules -Nodules have remained stable, multiple CTs compared  Shortness of breath with activity -To continue Breo  Chronic obstructive pulmonary disease -Obstructive defect on PFT with some improvement with bronchodilators  She does not feel any worse with her breathing -Will stay on the Breo at present   Plan/Recommendations:  Continue Breo  Repeat CT scan of the chest in 6 months  Encouraged to call with significant concerns  I spent 30 minutes dedicated to the care of this patient on the date of this encounter to include previsit review of records, face-to-face time with the patient discussing conditions above, post visit ordering of testing,ordering medications,independentlyinterpreting results, clinical documentation with electronic health record and communicated necessary findings to members of the patient's care team  Virl Diamond MD [facility] 06/09/2023, 10:18 AM  CC: Melvenia Beam,*

## 2023-06-17 ENCOUNTER — Other Ambulatory Visit: Payer: Self-pay | Admitting: Orthopedic Surgery

## 2023-06-28 ENCOUNTER — Encounter (HOSPITAL_BASED_OUTPATIENT_CLINIC_OR_DEPARTMENT_OTHER): Payer: Self-pay | Admitting: Orthopedic Surgery

## 2023-06-28 ENCOUNTER — Other Ambulatory Visit: Payer: Self-pay

## 2023-07-06 NOTE — Anesthesia Preprocedure Evaluation (Signed)
Anesthesia Evaluation  Patient identified by MRN, date of birth, ID band Patient awake    Reviewed: Allergy & Precautions, NPO status , Patient's Chart, lab work & pertinent test results  History of Anesthesia Complications Negative for: history of anesthetic complications  Airway Mallampati: I  TM Distance: >3 FB Neck ROM: Full    Dental  (+) Dental Advisory Given   Pulmonary neg shortness of breath, neg sleep apnea, COPD,  COPD inhaler, neg recent URI Multiple lung nodules   Pulmonary exam normal breath sounds clear to auscultation       Cardiovascular hypertension (amlodipine, propranolol, valsartan), Pt. on medications and Pt. 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 Left ventricular ejection fraction, by visual estimation, is 60 to  65%. The left ventricle has normal function. Normal left ventricular size.  There is no left ventricular hypertrophy.   2. Left ventricular diastolic Doppler parameters are consistent with  impaired relaxation pattern of LV diastolic filling.   3. Global right ventricle has normal systolic function.The right  ventricular size is normal.   4. Left atrial size was normal.   5. Right atrial size was normal.   6. Mild mitral annular calcification.   7. The mitral valve is normal in structure. Trace mitral valve  regurgitation. No evidence of mitral stenosis.   8. The tricuspid valve is normal in structure. Tricuspid valve  regurgitation is trivial.   9. The aortic valve is normal in structure. Aortic valve regurgitation  was not visualized by color flow Doppler. Structurally normal aortic  valve, with no evidence of sclerosis or stenosis.  10. The pulmonic valve was normal in structure. Pulmonic valve  regurgitation is not visualized by color flow  Doppler.  11. Normal pulmonary artery systolic pressure.  12. The inferior vena cava is normal in size with greater than 50%  respiratory variability, suggesting right atrial pressure of 3 mmHg.  13. Normal LV systolic function; grade 1 diastolic dysfunction.     Neuro/Psych negative neurological ROS     GI/Hepatic negative GI ROS, Neg liver ROS,,,  Endo/Other  negative endocrine ROS    Renal/GU negative Renal ROS     Musculoskeletal  (+) Arthritis , Osteoarthritis,    Abdominal   Peds  Hematology negative hematology ROS (+)   Anesthesia Other Findings   Reproductive/Obstetrics                             Anesthesia Physical Anesthesia Plan  ASA: 2  Anesthesia Plan: General   Post-op Pain Management: Tylenol PO (pre-op)*   Induction: Intravenous  PONV Risk Score and Plan: 3 and Ondansetron, Dexamethasone and Treatment may vary due to age or medical condition  Airway Management Planned: LMA  Additional Equipment:   Intra-op Plan:   Post-operative Plan: Extubation in OR  Informed Consent: I have reviewed the patients History and Physical, chart, labs and discussed the procedure including the risks, benefits and alternatives for the proposed anesthesia with the patient or authorized representative who has indicated his/her understanding and acceptance.     Dental advisory given  Plan Discussed with: CRNA and Anesthesiologist  Anesthesia Plan Comments: (Risks of general anesthesia discussed including, but not limited to, sore throat, hoarse voice, chipped/damaged teeth, injury to vocal cords, nausea and vomiting, allergic reactions, lung infection,  heart attack, stroke, and death. All questions answered. )        Anesthesia Quick Evaluation

## 2023-07-07 ENCOUNTER — Other Ambulatory Visit: Payer: Self-pay

## 2023-07-07 ENCOUNTER — Ambulatory Visit (HOSPITAL_BASED_OUTPATIENT_CLINIC_OR_DEPARTMENT_OTHER): Payer: Self-pay | Admitting: Anesthesiology

## 2023-07-07 ENCOUNTER — Encounter (HOSPITAL_BASED_OUTPATIENT_CLINIC_OR_DEPARTMENT_OTHER): Payer: Self-pay | Admitting: Orthopedic Surgery

## 2023-07-07 ENCOUNTER — Encounter (HOSPITAL_BASED_OUTPATIENT_CLINIC_OR_DEPARTMENT_OTHER)
Admission: RE | Disposition: A | Discharge status: Home / Self Care | Payer: Self-pay | Source: Home / Self Care | Attending: Orthopedic Surgery

## 2023-07-07 ENCOUNTER — Ambulatory Visit (HOSPITAL_BASED_OUTPATIENT_CLINIC_OR_DEPARTMENT_OTHER)
Admission: RE | Admit: 2023-07-07 | Discharge: 2023-07-07 | Disposition: A | Discharge status: Home / Self Care | Payer: Medicare Other | Attending: Orthopedic Surgery | Admitting: Orthopedic Surgery

## 2023-07-07 DIAGNOSIS — M67241 Synovial hypertrophy, not elsewhere classified, right hand: Secondary | ICD-10-CM | POA: Diagnosis not present

## 2023-07-07 DIAGNOSIS — I440 Atrioventricular block, first degree: Secondary | ICD-10-CM | POA: Diagnosis not present

## 2023-07-07 DIAGNOSIS — I10 Essential (primary) hypertension: Secondary | ICD-10-CM | POA: Insufficient documentation

## 2023-07-07 DIAGNOSIS — M19041 Primary osteoarthritis, right hand: Secondary | ICD-10-CM | POA: Diagnosis not present

## 2023-07-07 DIAGNOSIS — I4510 Unspecified right bundle-branch block: Secondary | ICD-10-CM | POA: Insufficient documentation

## 2023-07-07 DIAGNOSIS — M1990 Unspecified osteoarthritis, unspecified site: Secondary | ICD-10-CM | POA: Diagnosis not present

## 2023-07-07 DIAGNOSIS — M67441 Ganglion, right hand: Secondary | ICD-10-CM | POA: Diagnosis not present

## 2023-07-07 DIAGNOSIS — Z79899 Other long term (current) drug therapy: Secondary | ICD-10-CM | POA: Diagnosis not present

## 2023-07-07 DIAGNOSIS — Z7951 Long term (current) use of inhaled steroids: Secondary | ICD-10-CM | POA: Diagnosis not present

## 2023-07-07 DIAGNOSIS — R918 Other nonspecific abnormal finding of lung field: Secondary | ICD-10-CM | POA: Diagnosis not present

## 2023-07-07 DIAGNOSIS — J449 Chronic obstructive pulmonary disease, unspecified: Secondary | ICD-10-CM | POA: Diagnosis not present

## 2023-07-07 DIAGNOSIS — M71341 Other bursal cyst, right hand: Secondary | ICD-10-CM | POA: Diagnosis not present

## 2023-07-07 DIAGNOSIS — Z01818 Encounter for other preprocedural examination: Secondary | ICD-10-CM

## 2023-07-07 HISTORY — PX: GANGLION CYST EXCISION: SHX1691

## 2023-07-07 HISTORY — PX: DISTAL INTERPHALANGEAL JOINT FUSION: SHX6428

## 2023-07-07 HISTORY — DX: Chronic obstructive pulmonary disease, unspecified: J44.9

## 2023-07-07 SURGERY — EXCISION, GANGLION CYST, WRIST
Anesthesia: General | Site: Finger | Laterality: Right

## 2023-07-07 MED ORDER — FENTANYL CITRATE (PF) 100 MCG/2ML IJ SOLN: 25.0000 ug | INTRAMUSCULAR | Status: DC | PRN

## 2023-07-07 MED ORDER — LACTATED RINGERS IV SOLN: INTRAVENOUS | Status: DC

## 2023-07-07 MED ORDER — BUPIVACAINE HCL (PF) 0.25 % IJ SOLN
INTRAMUSCULAR | Status: AC
  Filled 2023-07-07: qty 90

## 2023-07-07 MED ORDER — FENTANYL CITRATE (PF) 100 MCG/2ML IJ SOLN
INTRAMUSCULAR | Status: DC | PRN
  Administered 2023-07-07: 25 ug via INTRAVENOUS
  Administered 2023-07-07: 50 ug via INTRAVENOUS
  Administered 2023-07-07: 25 ug via INTRAVENOUS

## 2023-07-07 MED ORDER — DEXAMETHASONE SODIUM PHOSPHATE 10 MG/ML IJ SOLN
INTRAMUSCULAR | Status: AC
  Filled 2023-07-07: qty 1

## 2023-07-07 MED ORDER — LIDOCAINE 2% (20 MG/ML) 5 ML SYRINGE
INTRAMUSCULAR | Status: DC | PRN
  Administered 2023-07-07: 60 mg via INTRAVENOUS

## 2023-07-07 MED ORDER — ACETAMINOPHEN 500 MG PO TABS
ORAL_TABLET | ORAL | Status: AC
  Filled 2023-07-07: qty 1

## 2023-07-07 MED ORDER — PROPOFOL 500 MG/50ML IV EMUL
INTRAVENOUS | Status: AC
  Filled 2023-07-07: qty 50

## 2023-07-07 MED ORDER — FENTANYL CITRATE (PF) 100 MCG/2ML IJ SOLN
INTRAMUSCULAR | Status: AC
  Filled 2023-07-07: qty 2

## 2023-07-07 MED ORDER — PROPOFOL 10 MG/ML IV BOLUS
INTRAVENOUS | Status: DC | PRN
  Administered 2023-07-07: 50 mg via INTRAVENOUS
  Administered 2023-07-07: 100 mg via INTRAVENOUS
  Administered 2023-07-07: 50 mg via INTRAVENOUS

## 2023-07-07 MED ORDER — CEFAZOLIN SODIUM-DEXTROSE 2-4 GM/100ML-% IV SOLN
2.0000 g | INTRAVENOUS | Status: AC
  Administered 2023-07-07: 2 g via INTRAVENOUS

## 2023-07-07 MED ORDER — OXYCODONE HCL 5 MG PO TABS: 5.0000 mg | ORAL_TABLET | Freq: Once | ORAL | Status: DC | PRN

## 2023-07-07 MED ORDER — ACETAMINOPHEN 500 MG PO TABS
1000.0000 mg | ORAL_TABLET | Freq: Once | ORAL | Status: AC
  Administered 2023-07-07: 1000 mg via ORAL

## 2023-07-07 MED ORDER — AMISULPRIDE (ANTIEMETIC) 5 MG/2ML IV SOLN: 10.0000 mg | Freq: Once | INTRAVENOUS | Status: DC | PRN

## 2023-07-07 MED ORDER — BUPIVACAINE HCL (PF) 0.25 % IJ SOLN
INTRAMUSCULAR | Status: DC | PRN
  Administered 2023-07-07: 9 mL

## 2023-07-07 MED ORDER — OXYCODONE HCL 5 MG/5ML PO SOLN: 5.0000 mg | Freq: Once | ORAL | Status: DC | PRN

## 2023-07-07 MED ORDER — DEXAMETHASONE SODIUM PHOSPHATE 4 MG/ML IJ SOLN
INTRAMUSCULAR | Status: DC | PRN
  Administered 2023-07-07: 10 mg via INTRAVENOUS

## 2023-07-07 MED ORDER — ONDANSETRON HCL 4 MG/2ML IJ SOLN
INTRAMUSCULAR | Status: AC
  Filled 2023-07-07: qty 2

## 2023-07-07 MED ORDER — LIDOCAINE 2% (20 MG/ML) 5 ML SYRINGE
INTRAMUSCULAR | Status: AC
  Filled 2023-07-07: qty 5

## 2023-07-07 MED ORDER — EPHEDRINE SULFATE (PRESSORS) 50 MG/ML IJ SOLN
INTRAMUSCULAR | Status: DC | PRN
  Administered 2023-07-07 (×3): 5 mg via INTRAVENOUS
  Administered 2023-07-07: 10 mg via INTRAVENOUS

## 2023-07-07 MED ORDER — ONDANSETRON HCL 4 MG/2ML IJ SOLN
INTRAMUSCULAR | Status: DC | PRN
  Administered 2023-07-07: 4 mg via INTRAVENOUS

## 2023-07-07 MED ORDER — HYDROCODONE-ACETAMINOPHEN 5-325 MG PO TABS: 1.0000 | ORAL_TABLET | Freq: Four times a day (QID) | ORAL | 0 refills | Status: DC | PRN

## 2023-07-07 MED ORDER — CEFAZOLIN SODIUM-DEXTROSE 2-4 GM/100ML-% IV SOLN
INTRAVENOUS | Status: AC
  Filled 2023-07-07: qty 100

## 2023-07-07 MED ORDER — 0.9 % SODIUM CHLORIDE (POUR BTL) OPTIME
TOPICAL | Status: DC | PRN
  Administered 2023-07-07: 100 mL

## 2023-07-07 SURGICAL SUPPLY — 40 items
BENZOIN TINCTURE PRP APPL 2/3 (GAUZE/BANDAGES/DRESSINGS) IMPLANT
BLADE MINI RND TIP GREEN BEAV (BLADE) IMPLANT
BLADE SURG 15 STRL LF DISP TIS (BLADE) ×4 IMPLANT
BNDG ELASTIC 2INX 5YD STR LF (GAUZE/BANDAGES/DRESSINGS) IMPLANT
BNDG ELASTIC 3INX 5YD STR LF (GAUZE/BANDAGES/DRESSINGS) ×2 IMPLANT
BNDG ESMARK 4X9 LF (GAUZE/BANDAGES/DRESSINGS) ×1 IMPLANT
BNDG GAUZE DERMACEA FLUFF 4 (GAUZE/BANDAGES/DRESSINGS) ×2 IMPLANT
CHLORAPREP W/TINT 26 (MISCELLANEOUS) ×2 IMPLANT
CORD BIPOLAR FORCEPS 12FT (ELECTRODE) ×2 IMPLANT
COVER BACK TABLE 60X90IN (DRAPES) ×2 IMPLANT
COVER MAYO STAND STRL (DRAPES) ×2 IMPLANT
CUFF TOURN SGL QUICK 18X4 (TOURNIQUET CUFF) ×2 IMPLANT
DRAPE EXTREMITY T 121X128X90 (DISPOSABLE) ×2 IMPLANT
DRAPE SURG 17X23 STRL (DRAPES) ×2 IMPLANT
GAUZE PAD ABD 8X10 STRL (GAUZE/BANDAGES/DRESSINGS) IMPLANT
GAUZE SPONGE 4X4 12PLY STRL (GAUZE/BANDAGES/DRESSINGS) ×2 IMPLANT
GAUZE XEROFORM 1X8 LF (GAUZE/BANDAGES/DRESSINGS) ×2 IMPLANT
GLOVE BIO SURGEON STRL SZ7.5 (GLOVE) ×2 IMPLANT
GLOVE BIOGEL PI IND STRL 7.0 (GLOVE) ×1 IMPLANT
GLOVE BIOGEL PI IND STRL 8 (GLOVE) ×2 IMPLANT
GLOVE SURG SS PI 7.0 STRL IVOR (GLOVE) ×1 IMPLANT
GOWN STRL REUS W/ TWL LRG LVL3 (GOWN DISPOSABLE) ×2 IMPLANT
GOWN STRL REUS W/TWL XL LVL3 (GOWN DISPOSABLE) ×2 IMPLANT
NDL HYPO 25X1 1.5 SAFETY (NEEDLE) IMPLANT
NEEDLE HYPO 25X1 1.5 SAFETY (NEEDLE) ×2
NS IRRIG 1000ML POUR BTL (IV SOLUTION) ×2 IMPLANT
PACK BASIN DAY SURGERY FS (CUSTOM PROCEDURE TRAY) ×2 IMPLANT
PAD CAST 3X4 CTTN HI CHSV (CAST SUPPLIES) IMPLANT
PADDING CAST ABS COTTON 4X4 ST (CAST SUPPLIES) ×2 IMPLANT
SPLINT PLASTER CAST XFAST 3X15 (CAST SUPPLIES) IMPLANT
STOCKINETTE 4X48 STRL (DRAPES) ×2 IMPLANT
STRIP CLOSURE SKIN 1/2X4 (GAUZE/BANDAGES/DRESSINGS) IMPLANT
SUT ETHILON 4 0 PS 2 18 (SUTURE) ×1 IMPLANT
SUT MNCRL AB 4-0 PS2 18 (SUTURE) IMPLANT
SUT MON AB 5-0 PS2 18 (SUTURE) IMPLANT
SUT VIC AB 4-0 P2 18 (SUTURE) IMPLANT
SYR BULB EAR ULCER 3OZ GRN STR (SYRINGE) ×2 IMPLANT
SYR CONTROL 10ML LL (SYRINGE) ×1 IMPLANT
TOWEL GREEN STERILE FF (TOWEL DISPOSABLE) ×4 IMPLANT
UNDERPAD 30X36 HEAVY ABSORB (UNDERPADS AND DIAPERS) ×2 IMPLANT

## 2023-07-07 NOTE — Transfer of Care (Signed)
Immediate Anesthesia Transfer of Care Note  Patient: Kristy Jacobs  Procedure(s) Performed: right ring finger excision mucoid cyst (Right: Finger) debridement distal interphalangeal joint (Right: Finger)  Patient Location: PACU  Anesthesia Type:General  Level of Consciousness: sedated  Airway & Oxygen Therapy: Patient Spontanous Breathing and Patient connected to face mask oxygen  Post-op Assessment: Report given to RN and Post -op Vital signs reviewed and stable  Post vital signs: Reviewed and stable  Last Vitals:  Vitals Value Taken Time  BP 109/52 07/07/23 0920  Temp 36.2 C 07/07/23 0920  Pulse 64 07/07/23 0921  Resp 17 07/07/23 0921  SpO2 98 % 07/07/23 0921  Vitals shown include unfiled device data.  Last Pain:  Vitals:   07/07/23 0720  TempSrc: Temporal  PainSc: 0-No pain      Patients Stated Pain Goal: 3 (07/07/23 0720)  Complications: No notable events documented.

## 2023-07-07 NOTE — Anesthesia Postprocedure Evaluation (Signed)
Anesthesia Post Note  Patient: Kristy Jacobs  Procedure(s) Performed: right ring finger excision mucoid cyst (Right: Finger) debridement distal interphalangeal joint (Right: Finger)     Patient location during evaluation: PACU Anesthesia Type: General Level of consciousness: awake Pain management: pain level controlled Vital Signs Assessment: post-procedure vital signs reviewed and stable Respiratory status: spontaneous breathing, nonlabored ventilation and respiratory function stable Cardiovascular status: blood pressure returned to baseline and stable Postop Assessment: no apparent nausea or vomiting Anesthetic complications: no   No notable events documented.  Last Vitals:  Vitals:   07/07/23 0945 07/07/23 1000  BP: 121/63 127/69  Pulse: 68 72  Resp: 16 18  Temp:  (!) 36.2 C  SpO2: 99% 98%    Last Pain:  Vitals:   07/07/23 1000  TempSrc: Temporal  PainSc: 0-No pain                 Linton Rump

## 2023-07-07 NOTE — Discharge Instructions (Addendum)
[facility] Instructions Hand Surgery  Wound Care: Keep your hand elevated above the level of your heart.  Do not allow it to dangle by your side.  Keep the dressing dry and do not remove it unless your doctor advises you to do so.  He will usually change it at the time of your post-op visit.  Moving your fingers is advised to stimulate circulation but will depend on the site of your surgery.  If you have a splint applied, your doctor will advise you regarding movement.  Activity: Do not drive or operate machinery today.  Rest today and then you may return to your normal activity and work as indicated by your physician.  Diet:  Drink liquids today or eat a light diet.  You may resume a regular diet tomorrow.    General expectations: Pain for two to three days. Fingers may become slightly swollen.  Call your doctor if any of the following occur: Severe pain not relieved by pain medication. Elevated temperature. Dressing soaked with blood. Inability to move fingers. White or bluish color to fingers.   You may have Tylenol again after 1:20pm today, if needed.   Post Anesthesia Home Care Instructions  Activity: Get plenty of rest for the remainder of the day. A responsible individual must stay with you for 24 hours following the procedure.  For the next 24 hours, DO NOT: -Drive a car -Advertising copywriter -Drink alcoholic beverages -Take any medication unless instructed by your physician -Make any legal decisions or sign important papers.  Meals: Start with liquid foods such as gelatin or soup. Progress to regular foods as tolerated. Avoid greasy, spicy, heavy foods. If nausea and/or vomiting occur, drink only clear liquids until the nausea and/or vomiting subsides. Call your physician if vomiting continues.  Special Instructions/Symptoms: Your throat may feel dry or sore from the anesthesia or the breathing tube placed in your throat during surgery. If this causes discomfort,  gargle with warm salt water. The discomfort should disappear within 24 hours.  If you had a scopolamine patch placed behind your ear for the management of post- operative nausea and/or vomiting:  1. The medication in the patch is effective for 72 hours, after which it should be removed.  Wrap patch in a tissue and discard in the trash. Wash hands thoroughly with soap and water. 2. You may remove the patch earlier than 72 hours if you experience unpleasant side effects which may include dry mouth, dizziness or visual disturbances. 3. Avoid touching the patch. Wash your hands with soap and water after contact with the patch.

## 2023-07-07 NOTE — Anesthesia Procedure Notes (Signed)
Procedure Name: LMA Insertion Date/Time: 07/07/2023 8:48 AM  Performed by: Caren Macadam, CRNAPre-anesthesia Checklist: Patient identified, Emergency Drugs available, Suction available and Patient being monitored Patient Re-evaluated:Patient Re-evaluated prior to induction Oxygen Delivery Method: Circle system utilized Preoxygenation: Pre-oxygenation with 100% oxygen Induction Type: IV induction Ventilation: Mask ventilation without difficulty LMA: LMA inserted LMA Size: 4.0 Number of attempts: 1 Placement Confirmation: positive ETCO2 and breath sounds checked- equal and bilateral Tube secured with: Tape Dental Injury: Teeth and Oropharynx as per pre-operative assessment

## 2023-07-07 NOTE — H&P (Signed)
Kristy Jacobs is an 78 y.o. female.   Chief Complaint: mucoid cyst HPI: 78 yo female with mucoid cyst right ring finger x 3 years.  It has drained multiple time.  It is bothersome to her.  She wishes to have it removed and the dip joint debrided to try to prevent recurrence.  Allergies:  Allergies  Allergen Reactions   Ace Inhibitors Cough   Compazine [Prochlorperazine Edisylate] Nausea And Vomiting   Hctz [Hydrochlorothiazide]     hypokalemia    Past Medical History:  Diagnosis Date   Allergy    Arthritis    COPD (chronic obstructive pulmonary disease) (HCC)    Hypertension     Past Surgical History:  Procedure Laterality Date   JOINT REPLACEMENT Right    TKA    Family History: Family History  Problem Relation Age of Onset   Hypertension Mother    Hypertension Father    Cancer Father    Stroke Father    Hypertension Brother    Hypertension Sister     Social History:   reports that she has never smoked. She has never used smokeless tobacco. She reports current alcohol use of about 2.0 standard drinks of alcohol per week. She reports that she does not use drugs.  Medications: Medications Prior to Admission  Medication Sig Dispense Refill   amLODipine (NORVASC) 2.5 MG tablet Take 1 tablet (2.5 mg total) by mouth daily. 90 tablet 3   ascorbic acid (VITAMIN C) 500 MG tablet Take 500 mg by mouth daily.     ferrous sulfate 325 (65 FE) MG EC tablet Take 325 mg by mouth 3 (three) times daily with meals.     fluticasone (FLONASE) 50 MCG/ACT nasal spray Place into both nostrils daily.     fluticasone furoate-vilanterol (BREO ELLIPTA) 100-25 MCG/ACT AEPB Inhale 1 puff into the lungs daily. 180 each 3   montelukast (SINGULAIR) 10 MG tablet Take 1 tablet (10 mg total) by mouth at bedtime. 90 tablet 2   Multiple Vitamin (MULTIVITAMIN WITH MINERALS) TABS tablet Take 1 tablet by mouth daily.     Multiple Vitamins-Minerals (ICAPS AREDS 2 PO) Take by mouth.     Probiotic Product  (PROBIOTIC PO) Take 1 tablet by mouth daily.     propranolol (INDERAL) 10 MG tablet Take 10 mg by mouth as needed. Uses for tremors     valsartan (DIOVAN) 320 MG tablet TAKE 1 TABLET BY MOUTH DAILY 70 tablet 4   ipratropium (ATROVENT) 0.03 % nasal spray as needed.     nystatin (MYCOSTATIN/NYSTOP) powder as needed.      No results found for this or any previous visit (from the past 48 hours).  No results found.    Blood pressure (!) 159/78, pulse 74, temperature (!) 97.2 F (36.2 C), temperature source Temporal, resp. rate 20, height 5\' 4"  (1.626 m), weight 72.8 kg, SpO2 100%.  General appearance: alert, cooperative, and appears stated age Head: Normocephalic, without obvious abnormality, atraumatic Neck: supple, symmetrical, trachea midline Extremities: Intact sensation and capillary refill all digits.  +epl/fpl/io.  No wounds.  Skin: Skin color, texture, turgor normal. 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  Non operative and operative treatment options have been discussed with the patient and patient wishes to proceed with operative treatment. Risks, benefits, and alternatives of surgery have been discussed and the patient agrees with the plan of care.   Betha Loa 07/07/2023, 8:31 AM

## 2023-07-07 NOTE — Op Note (Signed)
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  Right ring finger excision mucoid cyst 2.  Right ring finger debridement of DIP joint   SURGEON:  Betha Loa, M.D.   ASSISTANT: none   ANESTHESIA:  General   INTRAVENOUS FLUIDS:  Per anesthesia flow sheet.   ESTIMATED BLOOD LOSS:  Minimal.   COMPLICATIONS:  None.   SPECIMENS: Right ring finger mucoid cyst to pathology   TOURNIQUET TIME:    Total Tourniquet Time Documented: Upper Arm (laterality) - 14 minutes Total: Upper Arm (laterality) - 14 minutes    DISPOSITION:  Stable to PACU.   INDICATIONS: 78 year old female with right ring finger mucoid cyst.  This been present for approximately 3 years.  It is bothersome to her.  It has broken open and leaked before.  She wishes to have it removed and the DIP joint debrided to try to prevent recurrence.  Risks, benefits and alternatives of surgery were discussed including the risks of blood loss, infection, damage to nerves, vessels, tendons, ligaments, bone for surgery, need for additional surgery, complications with wound healing, continued pain, stiffness, , recurrence.  She voiced understanding of these risks and elected to proceed.  OPERATIVE COURSE:  After being identified preoperatively by myself,  the patient and I agreed on the procedure and site of the procedure.  The surgical site was marked.  Surgical consent had been signed. Preoperative IV antibiotic prophylaxis was given. She was transferred to the operating room and placed on the operating table in supine position with the right upper extremity on an arm board.  General anesthesia  was induced by the anesthesiologist.  Right upper extremity was prepped and draped in normal sterile orthopedic fashion.  A surgical pause was performed between the surgeons, anesthesia, and operating room staff and all were in agreement as to the patient, procedure, and site of procedure.  Tourniquet at the proximal aspect of the extremity was inflated to 250 mmHg after exsanguination of the arm with an Esmarch bandage.  A hockey-stick shaped incision was made over the DIP joint of the ring finger.  This was carried in subcutaneous tissues by spreading technique.  The cyst was identified.  It was freed up from surrounding tissues and removed with the synovectomy rongeurs.  There was some cyst adjacent to the extensor tendon which was also removed.  This was sent to pathology for examination.  The DIP joint was entered underneath the extensor tendon and the synovectomy rongeurs used to debride the joint and perform synovectomy.  There was some prominent bone at the dorsal aspect of the middle phalanx condyle which was also taken down.  The wound was copiously irrigated with sterile saline and closed with 4-0 nylon in a horizontal mattress fashion.  It was then dressed with sterile Xeroform 4 x 4 and wrapped with a Coban dressing lightly.  An AlumaFoam splint was placed and wrapped lightly with Coban dressing.  Digital block was performed with quarter percent plain Marcaine to aid in postoperative analgesia.  The tourniquet was deflated at 14 minutes.  Fingertips were pink with brisk capillary refill after deflation of tourniquet.  The operative  drapes were broken down.  The patient was awoken from anesthesia safely.  She was transferred back to the stretcher and taken to PACU in stable condition.  I will see her back in the office in 1 week for postoperative followup.  I will give her a prescription for Norco 5/325 1 tab PO q6 hours prn pain, dispense # 15.   Betha Loa, MD Electronically signed, 07/07/23

## 2023-07-08 ENCOUNTER — Encounter (HOSPITAL_BASED_OUTPATIENT_CLINIC_OR_DEPARTMENT_OTHER): Payer: Self-pay | Admitting: Orthopedic Surgery

## 2023-07-08 LAB — SURGICAL PATHOLOGY

## 2023-07-15 DIAGNOSIS — M6740 Ganglion, unspecified site: Secondary | ICD-10-CM | POA: Diagnosis not present

## 2023-07-15 DIAGNOSIS — M19041 Primary osteoarthritis, right hand: Secondary | ICD-10-CM | POA: Diagnosis not present

## 2023-07-19 DIAGNOSIS — I8393 Asymptomatic varicose veins of bilateral lower extremities: Secondary | ICD-10-CM | POA: Diagnosis not present

## 2023-07-19 DIAGNOSIS — J449 Chronic obstructive pulmonary disease, unspecified: Secondary | ICD-10-CM | POA: Diagnosis not present

## 2023-07-19 DIAGNOSIS — E559 Vitamin D deficiency, unspecified: Secondary | ICD-10-CM | POA: Diagnosis not present

## 2023-07-19 DIAGNOSIS — M79644 Pain in right finger(s): Secondary | ICD-10-CM | POA: Diagnosis not present

## 2023-07-19 DIAGNOSIS — Z9889 Other specified postprocedural states: Secondary | ICD-10-CM | POA: Diagnosis not present

## 2023-07-19 DIAGNOSIS — F4320 Adjustment disorder, unspecified: Secondary | ICD-10-CM | POA: Diagnosis not present

## 2023-07-19 DIAGNOSIS — I700 Atherosclerosis of aorta: Secondary | ICD-10-CM | POA: Diagnosis not present

## 2023-07-19 DIAGNOSIS — D649 Anemia, unspecified: Secondary | ICD-10-CM | POA: Diagnosis not present

## 2023-07-19 DIAGNOSIS — D509 Iron deficiency anemia, unspecified: Secondary | ICD-10-CM | POA: Diagnosis not present

## 2023-07-22 DIAGNOSIS — M67441 Ganglion, right hand: Secondary | ICD-10-CM | POA: Diagnosis not present

## 2023-08-16 ENCOUNTER — Ambulatory Visit: Payer: 59 | Attending: Cardiology | Admitting: Cardiology

## 2023-08-16 ENCOUNTER — Encounter: Payer: Self-pay | Admitting: Cardiology

## 2023-08-16 VITALS — BP 160/92 | HR 74 | Ht 64.0 in | Wt 162.6 lb

## 2023-08-16 DIAGNOSIS — I4510 Unspecified right bundle-branch block: Secondary | ICD-10-CM

## 2023-08-16 DIAGNOSIS — I10 Essential (primary) hypertension: Secondary | ICD-10-CM | POA: Diagnosis not present

## 2023-08-16 MED ORDER — VALSARTAN 320 MG PO TABS: ORAL_TABLET | ORAL | 4 refills | Status: DC

## 2023-08-16 MED ORDER — AMLODIPINE BESYLATE 2.5 MG PO TABS: 2.5000 mg | ORAL_TABLET | Freq: Every day | ORAL | 3 refills | Status: AC

## 2023-08-16 NOTE — Patient Instructions (Addendum)
 Medication Instructions:  No Changes *If you need a refill on your cardiac medications before your next appointment, please call your pharmacy*   Lab Work: None If you have labs (blood work) drawn today and your tests are completely normal, you will receive your results only by: MyChart Message (if you have MyChart) OR A paper copy in the mail If you have any lab test that is abnormal or we need to change your treatment, we will call you to review the results.   Testing/Procedures: None   Follow-Up: At [facility], you and your health needs are our priority.  As part of our continuing mission to provide you with exceptional heart care, we have created designated Provider Care Teams.  These Care Teams include your primary Cardiologist (physician) and Advanced Practice Providers (APPs -  Physician Assistants and Nurse Practitioners) who all work together to provide you with the care you need, when you need it.   Your next appointment:   1 year(s)  Provider:   Thomasene Ripple, DO

## 2023-08-16 NOTE — Progress Notes (Signed)
 Cardiology Office Note:    Date:  08/16/2023   ID:  Kristy Jacobs, DOB 1946-06-12, MRN [id number]  PCP:  Melvenia Beam, MD  Cardiologist:  Thomasene Ripple, DO  Electrophysiologist:  None   Referring MD: Melvenia Beam,*   " I am ok'  History of Present Illness:    Kristy Jacobs is a 78 y.o. female with a hx of hypertension, mild aortic dilatation, coronary calcium score 0 in 2024.  No right bundle branch block.  She previously saw Dr. Shari Prows and this is my first visit with her.  She offers no complaints today.  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  She shared her home blood pressure with me which is averaging in the 130s.    Past Medical History:  Diagnosis Date   Allergy    Arthritis    COPD (chronic obstructive pulmonary disease) (HCC)    Hypertension     Past Surgical History:  Procedure Laterality Date   DISTAL INTERPHALANGEAL JOINT FUSION Right 07/07/2023   Procedure: debridement distal interphalangeal joint;  Surgeon: Betha Loa, MD;  Location: [facility];  Service: Orthopedics;  Laterality: Right;  time request only 30 mins   GANGLION CYST EXCISION Right 07/07/2023   Procedure: right ring finger excision mucoid cyst;  Surgeon: Betha Loa, MD;  Location: [facility];  Service: Orthopedics;  Laterality: Right;   JOINT REPLACEMENT Right    TKA    Current Medications: Current Meds  Medication Sig   amLODipine (NORVASC) 2.5 MG tablet Take 1 tablet (2.5 mg total) by mouth daily.   ascorbic acid (VITAMIN C) 500 MG tablet Take 500 mg by mouth daily.   fluticasone (FLONASE) 50 MCG/ACT nasal spray Place into both nostrils daily.   fluticasone furoate-vilanterol (BREO ELLIPTA) 100-25 MCG/ACT AEPB Inhale 1 puff into the lungs daily.   ipratropium (ATROVENT) 0.03 % nasal spray as needed.   montelukast (SINGULAIR) 10 MG tablet Take 1 tablet (10 mg total) by mouth at  bedtime.   Multiple Vitamin (MULTIVITAMIN WITH MINERALS) TABS tablet Take 1 tablet by mouth daily.   Multiple Vitamins-Minerals (ICAPS AREDS 2 PO) Take by mouth.   nystatin (MYCOSTATIN/NYSTOP) powder as needed.   Probiotic Product (PROBIOTIC PO) Take 1 tablet by mouth daily.   propranolol (INDERAL) 10 MG tablet Take 10 mg by mouth as needed. Uses for tremors   valsartan (DIOVAN) 320 MG tablet TAKE 1 TABLET BY MOUTH DAILY     Allergies:   Ace inhibitors, Compazine [prochlorperazine edisylate], and Hctz [hydrochlorothiazide]   Social History   Socioeconomic History   Marital status: Married    Spouse name: Not on file   Number of children: Not on file   Years of education: Not on file   Highest education level: Not on file  Occupational History   Not on file  Tobacco Use   Smoking status: Never   Smokeless tobacco: Never  Vaping Use   Vaping status: Never Used  Substance and Sexual Activity   Alcohol use: Yes    Alcohol/week: 2.0 standard drinks of alcohol    Types: 2 Glasses of wine per week    Comment: 2-3 times /week of wine   Drug use: No   Sexual activity: Yes    Birth control/protection: Post-menopausal  Other Topics Concern   Not on file  Social History Narrative   Not on file   Social Drivers of Health  Financial Resource Strain: Low Risk  (02/19/2022)   Received from [facility], [facility]   Overall Financial Resource Strain (CARDIA)    Difficulty of Paying Living Expenses: Not hard at all  Food Insecurity: Low Risk  (04/26/2023)   Received from [facility]   Hunger Vital Sign    Worried About Running Out of Food in the Last Year: Never true    Ran Out of Food in the Last Year: Never true  Transportation Needs: No Transportation Needs (04/26/2023)   Received from [facility]    In the past 12 months, has lack of reliable transportation kept you from medical appointments, meetings, work or from getting things needed for daily  living? : No  Physical Activity: Not on file  Stress: No Stress Concern Present (02/19/2022)   Received from [facility], [facility] of Occupational Health - Occupational Stress Questionnaire    Feeling of Stress : Not at all  Social Connections: Unknown (01/06/2022)   [facility] from [facility]   Social Network    Social Network: Not on file     Family History: The patient's family history includes Cancer in her father; Hypertension in her brother, father, mother, and sister; Stroke in her father.  ROS:   Review of Systems  Constitution: Negative for decreased appetite, fever and weight gain.  HENT: Negative for congestion, ear discharge, hoarse voice and sore throat.   Eyes: Negative for discharge, redness, vision loss in right eye and visual halos.  Cardiovascular: Negative for chest pain, dyspnea on exertion, leg swelling, orthopnea and palpitations.  Respiratory: Negative for cough, hemoptysis, shortness of breath and snoring.   Endocrine: Negative for heat intolerance and polyphagia.  Hematologic/Lymphatic: Negative for bleeding problem. Does not bruise/bleed easily.  Skin: Negative for flushing, nail changes, rash and suspicious lesions.  Musculoskeletal: Negative for arthritis, joint pain, muscle cramps, myalgias, neck pain and stiffness.  Gastrointestinal: Negative for abdominal pain, bowel incontinence, diarrhea and excessive appetite.  Genitourinary: Negative for decreased libido, genital sores and incomplete emptying.  Neurological: Negative for brief paralysis, focal weakness, headaches and loss of balance.  Psychiatric/Behavioral: Negative for altered mental status, depression and suicidal ideas.  Allergic/Immunologic: Negative for HIV exposure and persistent infections.    EKGs/Labs/Other Studies Reviewed:    The following studies were reviewed today:   EKG:  The ekg ordered today demonstrates sinus rhythm, heart rate 74 bpm  with right bundle branch block.  Compared to prior EKG) branch block is not new.  Recent Labs: No results found for requested labs within last 365 days.  Recent Lipid Panel    Component Value Date/Time   CHOL 199 08/05/2022 0815   TRIG 86 08/05/2022 0815   HDL 79 08/05/2022 0815   CHOLHDL 2.5 08/05/2022 0815   CHOLHDL 2.8 07/22/2015 0954   VLDL 16 07/22/2015 0954   LDLCALC 105 (H) 08/05/2022 0815    Physical Exam:    VS:  BP (!) 158/94 (BP Location: Left Arm, Patient Position: Sitting, Cuff Size: Normal)   Pulse 74   Ht 5\' 4"  (1.626 m)   Wt 162 lb 9.6 oz (73.8 kg)   SpO2 99%   BMI 27.91 kg/m     Wt Readings from Last 3 Encounters:  08/16/23 162 lb 9.6 oz (73.8 kg)  07/07/23 160 lb 7.9 oz (72.8 kg)  06/09/23 161 lb (73 kg)     GEN: Well nourished, well developed in no acute distress HEENT:  Normal NECK: No JVD; No carotid bruits LYMPHATICS: No lymphadenopathy CARDIAC: S1S2 noted,RRR, no murmurs, rubs, gallops RESPIRATORY:  Clear to auscultation without rales, wheezing or rhonchi  ABDOMEN: Soft, non-tender, non-distended, +bowel sounds, no guarding. EXTREMITIES: No edema, No cyanosis, no clubbing MUSCULOSKELETAL:  No deformity  SKIN: Warm and dry NEUROLOGIC:  Alert and oriented x 3, non-focal PSYCHIATRIC:  Normal affect, good insight  ASSESSMENT:    1. Primary hypertension    PLAN:     Blood pressure is elevated in the office today.  But I was able to review her home blood pressure which actually is averaging systolics in the 130s therefore I would not adjust any antihypertensives to avoid medication induced hypotension.  No symptoms  The patient is in agreement with the above plan. The patient left the office in stable condition.  The patient will follow up in   Medication Adjustments/Labs and Tests Ordered: Current medicines are reviewed at length with the patient today.  Concerns regarding medicines are outlined above.  Orders Placed This Encounter   Procedures   EKG 12-Lead   No orders of the defined types were placed in this encounter.   There are no Patient Instructions on file for this visit.   Adopting a Healthy Lifestyle.  Know what a healthy weight is for you (roughly BMI <25) and aim to maintain this   Aim for 7+ servings of fruits and vegetables daily   65-80+ fluid ounces of water or unsweet tea for healthy kidneys   Limit to max 1 drink of alcohol per day; avoid smoking/tobacco   Limit animal fats in diet for cholesterol and heart health - choose grass fed whenever available   Avoid highly processed foods, and foods high in saturated/trans fats   Aim for low stress - take time to unwind and care for your mental health   Aim for 150 min of moderate intensity exercise weekly for heart health, and weights twice weekly for bone health   Aim for 7-9 hours of sleep daily   When it comes to diets, agreement about the perfect plan isnt easy to find, even among the experts. Experts at the [facility] [employer] developed an idea known as the Healthy Eating Plate. Just imagine a plate divided into logical, healthy portions.   The emphasis is on diet quality:   Load up on vegetables and fruits - one-half of your plate: Aim for color and variety, and remember that potatoes dont count.   Go for whole grains - one-quarter of your plate: Whole wheat, barley, wheat berries, quinoa, oats, brown rice, and foods made with them. If you want pasta, go with whole wheat pasta.   Protein power - one-quarter of your plate: Fish, chicken, beans, and nuts are all healthy, versatile protein sources. Limit red meat.   The diet, however, does go beyond the plate, offering a few other suggestions.   Use healthy plant oils, such as olive, canola, soy, corn, sunflower and peanut. Check the labels, and avoid partially hydrogenated oil, which have unhealthy trans fats.   If youre thirsty, drink water. Coffee and tea are good in  moderation, but skip sugary drinks and limit milk and dairy products to one or two daily servings.   The type of carbohydrate in the diet is more important than the amount. Some sources of carbohydrates, such as vegetables, fruits, whole grains, and beans-are healthier than others.   Finally, stay active  Signed, Thomasene Ripple, DO  08/16/2023 9:58 AM  [facility]

## 2023-09-08 ENCOUNTER — Encounter: Payer: Self-pay | Admitting: Pulmonary Disease

## 2023-09-09 MED ORDER — FLUTICASONE FUROATE-VILANTEROL 100-25 MCG/ACT IN AEPB: 1.0000 | INHALATION_SPRAY | Freq: Every day | RESPIRATORY_TRACT | 3 refills | Status: AC

## 2023-09-09 MED ORDER — MONTELUKAST SODIUM 10 MG PO TABS: 10.0000 mg | ORAL_TABLET | Freq: Every day | ORAL | 3 refills | Status: AC

## 2023-09-13 DIAGNOSIS — Z1231 Encounter for screening mammogram for malignant neoplasm of breast: Secondary | ICD-10-CM | POA: Diagnosis not present

## 2023-10-11 DIAGNOSIS — K080 Exfoliation of teeth due to systemic causes: Secondary | ICD-10-CM | POA: Diagnosis not present

## 2023-11-10 ENCOUNTER — Encounter: Payer: Self-pay | Admitting: Pulmonary Disease

## 2023-11-29 ENCOUNTER — Telehealth: Payer: Self-pay

## 2023-11-29 NOTE — Telephone Encounter (Signed)
Appt has been made and pt is aware.

## 2023-11-29 NOTE — Telephone Encounter (Signed)
 PCC's, the referral was made and under comments, it was stated to go to the pt's preferred location. Can you look into this please and have pt scheduled ASAP.

## 2023-11-30 DIAGNOSIS — H40013 Open angle with borderline findings, low risk, bilateral: Secondary | ICD-10-CM | POA: Diagnosis not present

## 2023-11-30 DIAGNOSIS — H2513 Age-related nuclear cataract, bilateral: Secondary | ICD-10-CM | POA: Diagnosis not present

## 2023-11-30 DIAGNOSIS — H04123 Dry eye syndrome of bilateral lacrimal glands: Secondary | ICD-10-CM | POA: Diagnosis not present

## 2023-11-30 DIAGNOSIS — H0288A Meibomian gland dysfunction right eye, upper and lower eyelids: Secondary | ICD-10-CM | POA: Diagnosis not present

## 2023-12-06 NOTE — Telephone Encounter (Signed)
 Can you help with this?

## 2023-12-07 ENCOUNTER — Ambulatory Visit (HOSPITAL_BASED_OUTPATIENT_CLINIC_OR_DEPARTMENT_OTHER)

## 2023-12-08 ENCOUNTER — Ambulatory Visit (HOSPITAL_BASED_OUTPATIENT_CLINIC_OR_DEPARTMENT_OTHER)

## 2023-12-09 ENCOUNTER — Ambulatory Visit (HOSPITAL_BASED_OUTPATIENT_CLINIC_OR_DEPARTMENT_OTHER)
Admission: RE | Admit: 2023-12-09 | Discharge: 2023-12-09 | Disposition: A | Discharge status: Home / Self Care | Source: Ambulatory Visit | Attending: Pulmonary Disease | Admitting: Pulmonary Disease

## 2023-12-09 DIAGNOSIS — R918 Other nonspecific abnormal finding of lung field: Secondary | ICD-10-CM | POA: Diagnosis not present

## 2023-12-26 ENCOUNTER — Ambulatory Visit: Admitting: Pulmonary Disease

## 2023-12-26 VITALS — BP 136/82 | HR 80 | Ht 64.0 in | Wt 166.0 lb

## 2023-12-26 DIAGNOSIS — R9389 Abnormal findings on diagnostic imaging of other specified body structures: Secondary | ICD-10-CM

## 2023-12-26 DIAGNOSIS — J449 Chronic obstructive pulmonary disease, unspecified: Secondary | ICD-10-CM

## 2023-12-26 DIAGNOSIS — R918 Other nonspecific abnormal finding of lung field: Secondary | ICD-10-CM

## 2023-12-26 DIAGNOSIS — R0609 Other forms of dyspnea: Secondary | ICD-10-CM

## 2023-12-26 NOTE — Progress Notes (Signed)
 Kristy Jacobs    [id number]    November 23, 1945  Primary Care Physician:Clark, Lacinda Browning, MD  Referring Physician: [name], MD [address]  [address] [place],  [place] [address]  Chief complaint: Being seen for shortness of breath Breathing remains about the same  HPI:  Breathing feels about the same  Continues with [facility]  She did have a follow-up CT which showed stable lung nodules  Breathing feels stable, tries to stay active Occasional wheezing from possible postnasal drip  She does continue to use Atrovent and Flonase for nasal stuffiness  Compliant with Breo  Up-to-date with colonoscopy, mammograms gynecological follow-up  PFT with severe obstructive disease  She does have iron deficiency  Never smoker Some exposure to secondhand smoke  No occupational predisposition to lung disease  Outpatient Encounter Medications as of 12/26/2023  Medication Sig   amLODipine  (NORVASC ) 2.5 MG tablet Take 1 tablet (2.5 mg total) by mouth daily.   ascorbic acid (VITAMIN C) 500 MG tablet Take 500 mg by mouth daily.   fluticasone  (FLONASE) 50 MCG/ACT nasal spray Place into both nostrils daily.   fluticasone  furoate-vilanterol (BREO ELLIPTA ) 100-25 MCG/ACT AEPB Inhale 1 puff into the lungs daily.   ipratropium (ATROVENT) 0.03 % nasal spray as needed.   montelukast  (SINGULAIR ) 10 MG tablet Take 1 tablet (10 mg total) by mouth at bedtime.   Multiple Vitamin (MULTIVITAMIN WITH MINERALS) TABS tablet Take 1 tablet by mouth daily.   Multiple Vitamins-Minerals (ICAPS AREDS 2 PO) Take by mouth.   nystatin  (MYCOSTATIN /NYSTOP ) powder as needed.   Probiotic Product (PROBIOTIC PO) Take 1 tablet by mouth daily.   propranolol (INDERAL) 10 MG tablet Take 10 mg by mouth as needed. Uses for tremors   valsartan  (DIOVAN ) 320 MG tablet TAKE 1 TABLET BY MOUTH DAILY   [DISCONTINUED] ferrous sulfate 325 (65 FE) MG EC tablet Take 325 mg by mouth 3 (three) times daily with  meals.   [DISCONTINUED] HYDROcodone -acetaminophen  (NORCO/VICODIN) 5-325 MG tablet Take 1 tablet by mouth every 6 (six) hours as needed for moderate pain (pain score 4-6).   No facility-administered encounter medications on file as of 12/26/2023.    Allergies as of 12/26/2023 - Review Complete 12/26/2023  Allergen Reaction Noted   Ace inhibitors Cough 02/28/2020   Compazine [prochlorperazine edisylate] Nausea And Vomiting 01/28/2014   Hctz [hydrochlorothiazide ]  04/17/2020    Past Medical History:  Diagnosis Date   Allergy    Arthritis    COPD (chronic obstructive pulmonary disease) (HCC)    Hypertension     Past Surgical History:  Procedure Laterality Date   DISTAL INTERPHALANGEAL JOINT FUSION Right 07/07/2023   Procedure: debridement distal interphalangeal joint;  Surgeon: Murrell Drivers, MD;  Location: [facility];  Service: Orthopedics;  Laterality: Right;  time request only 30 mins   GANGLION CYST EXCISION Right 07/07/2023   Procedure: right ring finger excision mucoid cyst;  Surgeon: Murrell Drivers, MD;  Location: [facility];  Service: Orthopedics;  Laterality: Right;   JOINT REPLACEMENT Right    TKA    Family History  Problem Relation Age of Onset   Hypertension Mother    Hypertension Father    Cancer Father    Stroke Father    Hypertension Brother    Hypertension Sister     Social History   Socioeconomic History   Marital status: Married    Spouse name: Not on file   Number of children: Not  on file   Years of education: Not on file   Highest education level: Not on file  Occupational History   Not on file  Tobacco Use   Smoking status: Never   Smokeless tobacco: Never  Vaping Use   Vaping status: Never Used  Substance and Sexual Activity   Alcohol use: Yes    Alcohol/week: 2.0 standard drinks of alcohol    Types: 2 Glasses of wine per week    Comment: 2-3 times /week of wine   Drug use: No   Sexual activity: Yes    Birth  control/protection: Post-menopausal  Other Topics Concern   Not on file  Social History Narrative   Not on file   Social Drivers of Health   Financial Resource Strain: Low Risk  (02/19/2022)   Received from [facility]   Overall Financial Resource Strain (CARDIA)    Difficulty of Paying Living Expenses: Not hard at all  Food Insecurity: Low Risk  (04/26/2023)   Received from [facility]   Hunger Vital Sign    Within the past 12 months, you worried that your food would run out before you got money to buy more: Never true    Within the past 12 months, the food you bought just didn't last and you didn't have money to get more. : Never true  Transportation Needs: No Transportation Needs (04/26/2023)   Received from [facility]    In the past 12 months, has lack of reliable transportation kept you from medical appointments, meetings, work or from getting things needed for daily living? : No  Physical Activity: Not on file  Stress: No Stress Concern Present (02/19/2022)   Received from [facility] - Occupational Stress Questionnaire    Feeling of Stress : Not at all  Social Connections: Unknown (01/06/2022)   Received from [facility]   Social Network    Social Network: Not on file  Intimate Partner Violence: Unknown (01/06/2022)   Received from [facility]   HITS    Physically Hurt: Not on file    Insult or Talk Down To: Not on file    Threaten Physical Harm: Not on file    Scream or Curse: Not on file    Review of Systems  Respiratory:  Negative for cough and shortness of breath.   Cardiovascular:  Negative for chest pain.  All other systems reviewed and are negative.   Vitals:   12/26/23 1125 12/26/23 1127  BP: 136/82 136/82  Pulse: 80   SpO2: 97%       Physical Exam Constitutional:      Appearance: Normal appearance.  HENT:     Head: Normocephalic.     Nose: No congestion.     Mouth/Throat:      Mouth: Mucous membranes are moist.     Pharynx: No posterior oropharyngeal erythema.  Eyes:     General:        Right eye: No discharge.        Left eye: No discharge.  Cardiovascular:     Rate and Rhythm: Normal rate and regular rhythm.     Heart sounds: No murmur heard.    No friction rub.  Pulmonary:     Effort: No respiratory distress.     Breath sounds: No stridor. No wheezing.  Musculoskeletal:     Cervical back: No rigidity or tenderness.  Neurological:     Mental Status: She is alert.  Psychiatric:        Mood and Affect: Mood normal.    Data Reviewed: Chest x-ray 03/11/2020-no infiltrative process noted on chest x-ray reviewed with the patient Yeah hopefully so Exercise stress study reviewed  PFT with severe obstructive disease with no significant bronchodilator response  Most recent CT June 2025 reviewed showing stable lung nodules compared to the 2 previous ones in 2024  Assessment:   Multiple lung nodules - Nodules have remained stable - Plan will be to repeat in a year  Shortness of breath with activity Chronic obstructive pulmonary disease - PFT with obstructive disease - Will continue Breo  Encouraged to continue to stay active   Plan/Recommendations: Continue Breo  Repeat CT scan in a year  Encouraged to call with significant concerns  Follow-up 1 year from now  Jennet Epley MD [facility] 12/26/2023, 11:33 AM  CC: Kristy Jacobs,*

## 2023-12-26 NOTE — Patient Instructions (Signed)
 Chronic obstructive pulmonary disease - Continue using your Breo inhaler on a regular basis  Spots in the lung as stable on the last 2 CAT scans you have had  Plan will be to repeat the CAT scan in a year from now  Call us  with significant concerns  Continue graded activities and stay active  Follow-up in a year

## 2024-02-09 DIAGNOSIS — J449 Chronic obstructive pulmonary disease, unspecified: Secondary | ICD-10-CM | POA: Diagnosis not present

## 2024-02-14 DIAGNOSIS — L304 Erythema intertrigo: Secondary | ICD-10-CM | POA: Diagnosis not present

## 2024-02-14 DIAGNOSIS — L821 Other seborrheic keratosis: Secondary | ICD-10-CM | POA: Diagnosis not present

## 2024-02-14 DIAGNOSIS — D485 Neoplasm of uncertain behavior of skin: Secondary | ICD-10-CM | POA: Diagnosis not present

## 2024-02-14 DIAGNOSIS — L578 Other skin changes due to chronic exposure to nonionizing radiation: Secondary | ICD-10-CM | POA: Diagnosis not present

## 2024-02-14 DIAGNOSIS — L814 Other melanin hyperpigmentation: Secondary | ICD-10-CM | POA: Diagnosis not present

## 2024-03-13 NOTE — Telephone Encounter (Signed)
 Pt has been scheduled.

## 2024-06-25 ENCOUNTER — Encounter: Payer: Self-pay | Admitting: Cardiology

## 2024-07-16 ENCOUNTER — Other Ambulatory Visit: Payer: Self-pay | Admitting: Cardiology

## 2024-07-18 NOTE — Telephone Encounter (Signed)
 Labs Normal on 03/14/24

## 2024-08-31 ENCOUNTER — Ambulatory Visit: Admitting: Cardiology
